# Patient Record
Sex: Male | Born: 1935 | Race: White | Hispanic: No | Marital: Married | Smoking: Current every day smoker
Health system: Southern US, Community
[De-identification: ages and names within clinical notes are randomized; demographics above are authoritative.]

## PROBLEM LIST (undated history)

## (undated) DIAGNOSIS — I10 Essential (primary) hypertension: Secondary | ICD-10-CM

## (undated) DIAGNOSIS — E119 Type 2 diabetes mellitus without complications: Secondary | ICD-10-CM

## (undated) HISTORY — DX: Essential (primary) hypertension: I10

## (undated) HISTORY — PX: CHOLECYSTECTOMY: SHX55

## (undated) HISTORY — DX: Type 2 diabetes mellitus without complications: E11.9

---

## 2005-07-14 ENCOUNTER — Other Ambulatory Visit: Payer: Self-pay

## 2005-07-15 ENCOUNTER — Inpatient Hospital Stay: Payer: Self-pay | Admitting: Internal Medicine

## 2006-01-06 ENCOUNTER — Other Ambulatory Visit: Payer: Self-pay

## 2006-01-06 ENCOUNTER — Emergency Department: Payer: Self-pay | Admitting: Emergency Medicine

## 2006-01-11 ENCOUNTER — Ambulatory Visit: Payer: Self-pay | Admitting: Urology

## 2009-12-06 ENCOUNTER — Ambulatory Visit: Payer: Self-pay | Admitting: Family

## 2011-05-29 DIAGNOSIS — I10 Essential (primary) hypertension: Secondary | ICD-10-CM | POA: Diagnosis not present

## 2011-05-29 DIAGNOSIS — E119 Type 2 diabetes mellitus without complications: Secondary | ICD-10-CM | POA: Diagnosis not present

## 2011-05-29 DIAGNOSIS — Z87891 Personal history of nicotine dependence: Secondary | ICD-10-CM | POA: Diagnosis not present

## 2011-08-01 DIAGNOSIS — E119 Type 2 diabetes mellitus without complications: Secondary | ICD-10-CM | POA: Diagnosis not present

## 2011-08-01 DIAGNOSIS — Z8739 Personal history of other diseases of the musculoskeletal system and connective tissue: Secondary | ICD-10-CM | POA: Diagnosis not present

## 2011-08-01 DIAGNOSIS — I10 Essential (primary) hypertension: Secondary | ICD-10-CM | POA: Diagnosis not present

## 2011-08-01 DIAGNOSIS — Z87891 Personal history of nicotine dependence: Secondary | ICD-10-CM | POA: Diagnosis not present

## 2011-10-31 DIAGNOSIS — I10 Essential (primary) hypertension: Secondary | ICD-10-CM | POA: Diagnosis not present

## 2011-10-31 DIAGNOSIS — E119 Type 2 diabetes mellitus without complications: Secondary | ICD-10-CM | POA: Diagnosis not present

## 2011-10-31 DIAGNOSIS — Z87891 Personal history of nicotine dependence: Secondary | ICD-10-CM | POA: Diagnosis not present

## 2011-11-14 DIAGNOSIS — E119 Type 2 diabetes mellitus without complications: Secondary | ICD-10-CM | POA: Diagnosis not present

## 2011-11-14 DIAGNOSIS — E785 Hyperlipidemia, unspecified: Secondary | ICD-10-CM | POA: Diagnosis not present

## 2011-11-14 DIAGNOSIS — I10 Essential (primary) hypertension: Secondary | ICD-10-CM | POA: Diagnosis not present

## 2012-02-15 DIAGNOSIS — Z23 Encounter for immunization: Secondary | ICD-10-CM | POA: Diagnosis not present

## 2012-02-15 DIAGNOSIS — I129 Hypertensive chronic kidney disease with stage 1 through stage 4 chronic kidney disease, or unspecified chronic kidney disease: Secondary | ICD-10-CM | POA: Diagnosis not present

## 2012-02-15 DIAGNOSIS — E1129 Type 2 diabetes mellitus with other diabetic kidney complication: Secondary | ICD-10-CM | POA: Diagnosis not present

## 2012-04-12 DIAGNOSIS — H2510 Age-related nuclear cataract, unspecified eye: Secondary | ICD-10-CM | POA: Diagnosis not present

## 2012-05-17 DIAGNOSIS — E1129 Type 2 diabetes mellitus with other diabetic kidney complication: Secondary | ICD-10-CM | POA: Diagnosis not present

## 2012-05-17 DIAGNOSIS — I129 Hypertensive chronic kidney disease with stage 1 through stage 4 chronic kidney disease, or unspecified chronic kidney disease: Secondary | ICD-10-CM | POA: Diagnosis not present

## 2012-08-09 DIAGNOSIS — N181 Chronic kidney disease, stage 1: Secondary | ICD-10-CM | POA: Diagnosis not present

## 2012-08-09 DIAGNOSIS — I129 Hypertensive chronic kidney disease with stage 1 through stage 4 chronic kidney disease, or unspecified chronic kidney disease: Secondary | ICD-10-CM | POA: Diagnosis not present

## 2012-08-09 DIAGNOSIS — E1129 Type 2 diabetes mellitus with other diabetic kidney complication: Secondary | ICD-10-CM | POA: Diagnosis not present

## 2012-08-09 DIAGNOSIS — N189 Chronic kidney disease, unspecified: Secondary | ICD-10-CM | POA: Diagnosis not present

## 2012-11-07 DIAGNOSIS — N189 Chronic kidney disease, unspecified: Secondary | ICD-10-CM | POA: Diagnosis not present

## 2012-11-07 DIAGNOSIS — I129 Hypertensive chronic kidney disease with stage 1 through stage 4 chronic kidney disease, or unspecified chronic kidney disease: Secondary | ICD-10-CM | POA: Diagnosis not present

## 2012-11-07 DIAGNOSIS — N181 Chronic kidney disease, stage 1: Secondary | ICD-10-CM | POA: Diagnosis not present

## 2012-11-07 DIAGNOSIS — E1129 Type 2 diabetes mellitus with other diabetic kidney complication: Secondary | ICD-10-CM | POA: Diagnosis not present

## 2013-02-07 DIAGNOSIS — N189 Chronic kidney disease, unspecified: Secondary | ICD-10-CM | POA: Diagnosis not present

## 2013-02-07 DIAGNOSIS — E1129 Type 2 diabetes mellitus with other diabetic kidney complication: Secondary | ICD-10-CM | POA: Diagnosis not present

## 2013-02-07 DIAGNOSIS — I129 Hypertensive chronic kidney disease with stage 1 through stage 4 chronic kidney disease, or unspecified chronic kidney disease: Secondary | ICD-10-CM | POA: Diagnosis not present

## 2013-02-07 DIAGNOSIS — R42 Dizziness and giddiness: Secondary | ICD-10-CM | POA: Diagnosis not present

## 2013-05-01 DIAGNOSIS — I129 Hypertensive chronic kidney disease with stage 1 through stage 4 chronic kidney disease, or unspecified chronic kidney disease: Secondary | ICD-10-CM | POA: Diagnosis not present

## 2013-05-01 DIAGNOSIS — E1129 Type 2 diabetes mellitus with other diabetic kidney complication: Secondary | ICD-10-CM | POA: Diagnosis not present

## 2013-05-06 DIAGNOSIS — I129 Hypertensive chronic kidney disease with stage 1 through stage 4 chronic kidney disease, or unspecified chronic kidney disease: Secondary | ICD-10-CM | POA: Diagnosis not present

## 2013-05-06 DIAGNOSIS — E1129 Type 2 diabetes mellitus with other diabetic kidney complication: Secondary | ICD-10-CM | POA: Diagnosis not present

## 2013-05-06 DIAGNOSIS — N181 Chronic kidney disease, stage 1: Secondary | ICD-10-CM | POA: Diagnosis not present

## 2013-05-06 DIAGNOSIS — N189 Chronic kidney disease, unspecified: Secondary | ICD-10-CM | POA: Diagnosis not present

## 2013-05-06 DIAGNOSIS — F17200 Nicotine dependence, unspecified, uncomplicated: Secondary | ICD-10-CM | POA: Diagnosis not present

## 2013-08-08 DIAGNOSIS — E1129 Type 2 diabetes mellitus with other diabetic kidney complication: Secondary | ICD-10-CM | POA: Diagnosis not present

## 2013-08-08 DIAGNOSIS — N189 Chronic kidney disease, unspecified: Secondary | ICD-10-CM | POA: Diagnosis not present

## 2013-08-08 DIAGNOSIS — Z87891 Personal history of nicotine dependence: Secondary | ICD-10-CM | POA: Diagnosis not present

## 2013-08-08 DIAGNOSIS — F17200 Nicotine dependence, unspecified, uncomplicated: Secondary | ICD-10-CM | POA: Diagnosis not present

## 2013-10-08 DIAGNOSIS — J309 Allergic rhinitis, unspecified: Secondary | ICD-10-CM | POA: Diagnosis not present

## 2013-10-08 DIAGNOSIS — Z87891 Personal history of nicotine dependence: Secondary | ICD-10-CM | POA: Diagnosis not present

## 2013-10-08 DIAGNOSIS — E1129 Type 2 diabetes mellitus with other diabetic kidney complication: Secondary | ICD-10-CM | POA: Diagnosis not present

## 2013-10-08 DIAGNOSIS — N189 Chronic kidney disease, unspecified: Secondary | ICD-10-CM | POA: Diagnosis not present

## 2014-01-09 DIAGNOSIS — E1129 Type 2 diabetes mellitus with other diabetic kidney complication: Secondary | ICD-10-CM | POA: Diagnosis not present

## 2014-01-09 DIAGNOSIS — H9190 Unspecified hearing loss, unspecified ear: Secondary | ICD-10-CM | POA: Diagnosis not present

## 2014-01-09 DIAGNOSIS — Z87891 Personal history of nicotine dependence: Secondary | ICD-10-CM | POA: Diagnosis not present

## 2014-01-09 DIAGNOSIS — N189 Chronic kidney disease, unspecified: Secondary | ICD-10-CM | POA: Diagnosis not present

## 2014-01-17 DIAGNOSIS — Z23 Encounter for immunization: Secondary | ICD-10-CM | POA: Diagnosis not present

## 2014-04-03 DIAGNOSIS — Z1389 Encounter for screening for other disorder: Secondary | ICD-10-CM | POA: Diagnosis not present

## 2014-04-03 DIAGNOSIS — Z720 Tobacco use: Secondary | ICD-10-CM | POA: Diagnosis not present

## 2014-04-03 DIAGNOSIS — I10 Essential (primary) hypertension: Secondary | ICD-10-CM | POA: Diagnosis not present

## 2014-04-03 DIAGNOSIS — E1122 Type 2 diabetes mellitus with diabetic chronic kidney disease: Secondary | ICD-10-CM | POA: Diagnosis not present

## 2014-07-03 DIAGNOSIS — I10 Essential (primary) hypertension: Secondary | ICD-10-CM | POA: Diagnosis not present

## 2014-07-03 DIAGNOSIS — E119 Type 2 diabetes mellitus without complications: Secondary | ICD-10-CM | POA: Diagnosis not present

## 2014-07-07 DIAGNOSIS — Z23 Encounter for immunization: Secondary | ICD-10-CM | POA: Diagnosis not present

## 2014-10-02 DIAGNOSIS — E1122 Type 2 diabetes mellitus with diabetic chronic kidney disease: Secondary | ICD-10-CM | POA: Diagnosis not present

## 2014-10-02 DIAGNOSIS — E119 Type 2 diabetes mellitus without complications: Secondary | ICD-10-CM | POA: Diagnosis not present

## 2014-10-02 DIAGNOSIS — Z9889 Other specified postprocedural states: Secondary | ICD-10-CM | POA: Diagnosis not present

## 2014-10-02 DIAGNOSIS — I10 Essential (primary) hypertension: Secondary | ICD-10-CM | POA: Diagnosis not present

## 2014-11-02 DIAGNOSIS — Z720 Tobacco use: Secondary | ICD-10-CM | POA: Diagnosis not present

## 2014-11-02 DIAGNOSIS — E119 Type 2 diabetes mellitus without complications: Secondary | ICD-10-CM | POA: Diagnosis not present

## 2014-11-02 DIAGNOSIS — E1122 Type 2 diabetes mellitus with diabetic chronic kidney disease: Secondary | ICD-10-CM | POA: Diagnosis not present

## 2014-11-02 DIAGNOSIS — Z8739 Personal history of other diseases of the musculoskeletal system and connective tissue: Secondary | ICD-10-CM | POA: Diagnosis not present

## 2014-11-03 DIAGNOSIS — I10 Essential (primary) hypertension: Secondary | ICD-10-CM | POA: Diagnosis not present

## 2014-11-03 DIAGNOSIS — R5381 Other malaise: Secondary | ICD-10-CM | POA: Diagnosis not present

## 2014-11-03 DIAGNOSIS — Z125 Encounter for screening for malignant neoplasm of prostate: Secondary | ICD-10-CM | POA: Diagnosis not present

## 2014-11-03 DIAGNOSIS — E784 Other hyperlipidemia: Secondary | ICD-10-CM | POA: Diagnosis not present

## 2014-11-03 DIAGNOSIS — E119 Type 2 diabetes mellitus without complications: Secondary | ICD-10-CM | POA: Diagnosis not present

## 2015-01-04 DIAGNOSIS — E1122 Type 2 diabetes mellitus with diabetic chronic kidney disease: Secondary | ICD-10-CM | POA: Diagnosis not present

## 2015-01-04 DIAGNOSIS — Z9889 Other specified postprocedural states: Secondary | ICD-10-CM | POA: Diagnosis not present

## 2015-01-04 DIAGNOSIS — E119 Type 2 diabetes mellitus without complications: Secondary | ICD-10-CM | POA: Diagnosis not present

## 2015-01-04 DIAGNOSIS — Z720 Tobacco use: Secondary | ICD-10-CM | POA: Diagnosis not present

## 2015-01-05 DIAGNOSIS — Z23 Encounter for immunization: Secondary | ICD-10-CM | POA: Diagnosis not present

## 2015-01-14 DIAGNOSIS — H6122 Impacted cerumen, left ear: Secondary | ICD-10-CM | POA: Diagnosis not present

## 2015-01-14 DIAGNOSIS — H93292 Other abnormal auditory perceptions, left ear: Secondary | ICD-10-CM | POA: Diagnosis not present

## 2015-01-14 DIAGNOSIS — H60392 Other infective otitis externa, left ear: Secondary | ICD-10-CM | POA: Diagnosis not present

## 2015-01-28 DIAGNOSIS — H90A11 Conductive hearing loss, unilateral, right ear with restricted hearing on the contralateral side: Secondary | ICD-10-CM | POA: Diagnosis not present

## 2015-01-28 DIAGNOSIS — H902 Conductive hearing loss, unspecified: Secondary | ICD-10-CM | POA: Diagnosis not present

## 2015-01-28 DIAGNOSIS — H903 Sensorineural hearing loss, bilateral: Secondary | ICD-10-CM | POA: Diagnosis not present

## 2015-01-28 DIAGNOSIS — H6982 Other specified disorders of Eustachian tube, left ear: Secondary | ICD-10-CM | POA: Diagnosis not present

## 2015-01-28 DIAGNOSIS — H6502 Acute serous otitis media, left ear: Secondary | ICD-10-CM | POA: Diagnosis not present

## 2015-02-25 DIAGNOSIS — H6982 Other specified disorders of Eustachian tube, left ear: Secondary | ICD-10-CM | POA: Diagnosis not present

## 2015-02-25 DIAGNOSIS — H6502 Acute serous otitis media, left ear: Secondary | ICD-10-CM | POA: Diagnosis not present

## 2015-03-09 DIAGNOSIS — E1122 Type 2 diabetes mellitus with diabetic chronic kidney disease: Secondary | ICD-10-CM | POA: Diagnosis not present

## 2015-06-04 DIAGNOSIS — Z789 Other specified health status: Secondary | ICD-10-CM | POA: Diagnosis not present

## 2015-06-04 DIAGNOSIS — E119 Type 2 diabetes mellitus without complications: Secondary | ICD-10-CM | POA: Diagnosis not present

## 2015-06-04 DIAGNOSIS — E1122 Type 2 diabetes mellitus with diabetic chronic kidney disease: Secondary | ICD-10-CM | POA: Diagnosis not present

## 2015-06-04 DIAGNOSIS — I10 Essential (primary) hypertension: Secondary | ICD-10-CM | POA: Diagnosis not present

## 2015-09-02 DIAGNOSIS — E1122 Type 2 diabetes mellitus with diabetic chronic kidney disease: Secondary | ICD-10-CM | POA: Diagnosis not present

## 2015-09-02 DIAGNOSIS — E119 Type 2 diabetes mellitus without complications: Secondary | ICD-10-CM | POA: Diagnosis not present

## 2015-09-02 DIAGNOSIS — J302 Other seasonal allergic rhinitis: Secondary | ICD-10-CM | POA: Diagnosis not present

## 2015-09-02 DIAGNOSIS — I10 Essential (primary) hypertension: Secondary | ICD-10-CM | POA: Diagnosis not present

## 2015-12-03 DIAGNOSIS — E119 Type 2 diabetes mellitus without complications: Secondary | ICD-10-CM | POA: Diagnosis not present

## 2015-12-03 DIAGNOSIS — Z23 Encounter for immunization: Secondary | ICD-10-CM | POA: Diagnosis not present

## 2015-12-03 DIAGNOSIS — I10 Essential (primary) hypertension: Secondary | ICD-10-CM | POA: Diagnosis not present

## 2016-03-02 DIAGNOSIS — Z720 Tobacco use: Secondary | ICD-10-CM | POA: Diagnosis not present

## 2016-03-02 DIAGNOSIS — I10 Essential (primary) hypertension: Secondary | ICD-10-CM | POA: Diagnosis not present

## 2016-03-02 DIAGNOSIS — E119 Type 2 diabetes mellitus without complications: Secondary | ICD-10-CM | POA: Diagnosis not present

## 2016-06-01 DIAGNOSIS — Z87891 Personal history of nicotine dependence: Secondary | ICD-10-CM | POA: Diagnosis not present

## 2016-06-01 DIAGNOSIS — Z789 Other specified health status: Secondary | ICD-10-CM | POA: Diagnosis not present

## 2016-06-01 DIAGNOSIS — R42 Dizziness and giddiness: Secondary | ICD-10-CM | POA: Diagnosis not present

## 2016-06-01 DIAGNOSIS — E1122 Type 2 diabetes mellitus with diabetic chronic kidney disease: Secondary | ICD-10-CM | POA: Diagnosis not present

## 2016-08-29 DIAGNOSIS — R42 Dizziness and giddiness: Secondary | ICD-10-CM | POA: Diagnosis not present

## 2016-08-29 DIAGNOSIS — Z87891 Personal history of nicotine dependence: Secondary | ICD-10-CM | POA: Diagnosis not present

## 2016-08-29 DIAGNOSIS — E1122 Type 2 diabetes mellitus with diabetic chronic kidney disease: Secondary | ICD-10-CM | POA: Diagnosis not present

## 2016-08-29 DIAGNOSIS — I10 Essential (primary) hypertension: Secondary | ICD-10-CM | POA: Diagnosis not present

## 2016-11-06 ENCOUNTER — Other Ambulatory Visit: Payer: Self-pay

## 2016-12-07 DIAGNOSIS — E669 Obesity, unspecified: Secondary | ICD-10-CM | POA: Diagnosis not present

## 2016-12-07 DIAGNOSIS — R0602 Shortness of breath: Secondary | ICD-10-CM | POA: Diagnosis not present

## 2016-12-07 DIAGNOSIS — Z87891 Personal history of nicotine dependence: Secondary | ICD-10-CM | POA: Diagnosis not present

## 2016-12-07 DIAGNOSIS — Z23 Encounter for immunization: Secondary | ICD-10-CM | POA: Diagnosis not present

## 2016-12-07 DIAGNOSIS — E1122 Type 2 diabetes mellitus with diabetic chronic kidney disease: Secondary | ICD-10-CM | POA: Diagnosis not present

## 2017-03-01 DIAGNOSIS — E669 Obesity, unspecified: Secondary | ICD-10-CM | POA: Diagnosis not present

## 2017-03-01 DIAGNOSIS — I10 Essential (primary) hypertension: Secondary | ICD-10-CM | POA: Diagnosis not present

## 2017-03-01 DIAGNOSIS — F0390 Unspecified dementia without behavioral disturbance: Secondary | ICD-10-CM | POA: Diagnosis not present

## 2017-03-01 DIAGNOSIS — R42 Dizziness and giddiness: Secondary | ICD-10-CM | POA: Diagnosis not present

## 2017-06-05 DIAGNOSIS — E669 Obesity, unspecified: Secondary | ICD-10-CM | POA: Diagnosis not present

## 2017-06-05 DIAGNOSIS — E119 Type 2 diabetes mellitus without complications: Secondary | ICD-10-CM | POA: Diagnosis not present

## 2017-06-05 DIAGNOSIS — F0390 Unspecified dementia without behavioral disturbance: Secondary | ICD-10-CM | POA: Diagnosis not present

## 2017-06-05 DIAGNOSIS — R42 Dizziness and giddiness: Secondary | ICD-10-CM | POA: Diagnosis not present

## 2017-08-21 DIAGNOSIS — E669 Obesity, unspecified: Secondary | ICD-10-CM | POA: Diagnosis not present

## 2017-08-21 DIAGNOSIS — E119 Type 2 diabetes mellitus without complications: Secondary | ICD-10-CM | POA: Diagnosis not present

## 2017-08-21 DIAGNOSIS — R42 Dizziness and giddiness: Secondary | ICD-10-CM | POA: Diagnosis not present

## 2017-08-21 DIAGNOSIS — F0390 Unspecified dementia without behavioral disturbance: Secondary | ICD-10-CM | POA: Diagnosis not present

## 2017-10-23 DIAGNOSIS — R42 Dizziness and giddiness: Secondary | ICD-10-CM | POA: Diagnosis not present

## 2017-10-23 DIAGNOSIS — E669 Obesity, unspecified: Secondary | ICD-10-CM | POA: Diagnosis not present

## 2017-10-23 DIAGNOSIS — Z87891 Personal history of nicotine dependence: Secondary | ICD-10-CM | POA: Diagnosis not present

## 2017-10-23 DIAGNOSIS — E119 Type 2 diabetes mellitus without complications: Secondary | ICD-10-CM | POA: Diagnosis not present

## 2018-01-22 DIAGNOSIS — E669 Obesity, unspecified: Secondary | ICD-10-CM | POA: Diagnosis not present

## 2018-01-22 DIAGNOSIS — E119 Type 2 diabetes mellitus without complications: Secondary | ICD-10-CM | POA: Diagnosis not present

## 2018-01-22 DIAGNOSIS — Z23 Encounter for immunization: Secondary | ICD-10-CM | POA: Diagnosis not present

## 2018-01-22 DIAGNOSIS — R42 Dizziness and giddiness: Secondary | ICD-10-CM | POA: Diagnosis not present

## 2018-01-22 DIAGNOSIS — Z87891 Personal history of nicotine dependence: Secondary | ICD-10-CM | POA: Diagnosis not present

## 2018-02-18 DIAGNOSIS — E119 Type 2 diabetes mellitus without complications: Secondary | ICD-10-CM | POA: Diagnosis not present

## 2018-02-18 DIAGNOSIS — Z87891 Personal history of nicotine dependence: Secondary | ICD-10-CM | POA: Diagnosis not present

## 2018-02-18 DIAGNOSIS — R42 Dizziness and giddiness: Secondary | ICD-10-CM | POA: Diagnosis not present

## 2018-02-18 DIAGNOSIS — E669 Obesity, unspecified: Secondary | ICD-10-CM | POA: Diagnosis not present

## 2018-02-18 DIAGNOSIS — Z0000 Encounter for general adult medical examination without abnormal findings: Secondary | ICD-10-CM | POA: Diagnosis not present

## 2018-03-01 ENCOUNTER — Other Ambulatory Visit: Payer: Self-pay

## 2018-04-23 DIAGNOSIS — E669 Obesity, unspecified: Secondary | ICD-10-CM | POA: Diagnosis not present

## 2018-04-23 DIAGNOSIS — Z789 Other specified health status: Secondary | ICD-10-CM | POA: Diagnosis not present

## 2018-04-23 DIAGNOSIS — E119 Type 2 diabetes mellitus without complications: Secondary | ICD-10-CM | POA: Diagnosis not present

## 2018-04-23 DIAGNOSIS — R42 Dizziness and giddiness: Secondary | ICD-10-CM | POA: Diagnosis not present

## 2018-10-21 DIAGNOSIS — Z720 Tobacco use: Secondary | ICD-10-CM | POA: Diagnosis not present

## 2018-10-21 DIAGNOSIS — F0280 Dementia in other diseases classified elsewhere without behavioral disturbance: Secondary | ICD-10-CM | POA: Diagnosis not present

## 2018-10-21 DIAGNOSIS — E1121 Type 2 diabetes mellitus with diabetic nephropathy: Secondary | ICD-10-CM | POA: Diagnosis not present

## 2018-10-21 DIAGNOSIS — E1322 Other specified diabetes mellitus with diabetic chronic kidney disease: Secondary | ICD-10-CM | POA: Diagnosis not present

## 2018-12-30 DIAGNOSIS — E1121 Type 2 diabetes mellitus with diabetic nephropathy: Secondary | ICD-10-CM | POA: Diagnosis not present

## 2018-12-30 DIAGNOSIS — I129 Hypertensive chronic kidney disease with stage 1 through stage 4 chronic kidney disease, or unspecified chronic kidney disease: Secondary | ICD-10-CM | POA: Diagnosis not present

## 2018-12-30 DIAGNOSIS — R0602 Shortness of breath: Secondary | ICD-10-CM | POA: Diagnosis not present

## 2018-12-30 DIAGNOSIS — E119 Type 2 diabetes mellitus without complications: Secondary | ICD-10-CM | POA: Diagnosis not present

## 2018-12-30 DIAGNOSIS — E669 Obesity, unspecified: Secondary | ICD-10-CM | POA: Diagnosis not present

## 2018-12-30 DIAGNOSIS — E1122 Type 2 diabetes mellitus with diabetic chronic kidney disease: Secondary | ICD-10-CM | POA: Diagnosis not present

## 2018-12-30 DIAGNOSIS — R42 Dizziness and giddiness: Secondary | ICD-10-CM | POA: Diagnosis not present

## 2018-12-30 DIAGNOSIS — N401 Enlarged prostate with lower urinary tract symptoms: Secondary | ICD-10-CM | POA: Diagnosis not present

## 2018-12-30 DIAGNOSIS — F0280 Dementia in other diseases classified elsewhere without behavioral disturbance: Secondary | ICD-10-CM | POA: Diagnosis not present

## 2019-01-13 DIAGNOSIS — Z0000 Encounter for general adult medical examination without abnormal findings: Secondary | ICD-10-CM | POA: Diagnosis not present

## 2019-01-13 DIAGNOSIS — E1121 Type 2 diabetes mellitus with diabetic nephropathy: Secondary | ICD-10-CM | POA: Diagnosis not present

## 2019-01-13 DIAGNOSIS — E669 Obesity, unspecified: Secondary | ICD-10-CM | POA: Diagnosis not present

## 2019-01-13 DIAGNOSIS — F0280 Dementia in other diseases classified elsewhere without behavioral disturbance: Secondary | ICD-10-CM | POA: Diagnosis not present

## 2019-01-13 DIAGNOSIS — I129 Hypertensive chronic kidney disease with stage 1 through stage 4 chronic kidney disease, or unspecified chronic kidney disease: Secondary | ICD-10-CM | POA: Diagnosis not present

## 2019-01-20 DIAGNOSIS — Z23 Encounter for immunization: Secondary | ICD-10-CM | POA: Diagnosis not present

## 2019-02-05 ENCOUNTER — Ambulatory Visit: Payer: Self-pay | Admitting: Urology

## 2019-03-04 ENCOUNTER — Ambulatory Visit: Payer: Self-pay | Admitting: Urology

## 2019-04-01 ENCOUNTER — Ambulatory Visit (INDEPENDENT_AMBULATORY_CARE_PROVIDER_SITE_OTHER): Payer: Medicare Other | Admitting: Urology

## 2019-04-01 ENCOUNTER — Other Ambulatory Visit: Payer: Self-pay

## 2019-04-01 ENCOUNTER — Encounter: Payer: Self-pay | Admitting: Urology

## 2019-04-01 VITALS — BP 148/84 | HR 74 | Ht 72.0 in | Wt 200.0 lb

## 2019-04-01 DIAGNOSIS — N400 Enlarged prostate without lower urinary tract symptoms: Secondary | ICD-10-CM | POA: Diagnosis not present

## 2019-04-01 DIAGNOSIS — N138 Other obstructive and reflux uropathy: Secondary | ICD-10-CM | POA: Diagnosis not present

## 2019-04-01 DIAGNOSIS — R339 Retention of urine, unspecified: Secondary | ICD-10-CM | POA: Diagnosis not present

## 2019-04-01 DIAGNOSIS — N401 Enlarged prostate with lower urinary tract symptoms: Secondary | ICD-10-CM | POA: Diagnosis not present

## 2019-04-01 DIAGNOSIS — N3941 Urge incontinence: Secondary | ICD-10-CM | POA: Diagnosis not present

## 2019-04-01 MED ORDER — FINASTERIDE 5 MG PO TABS: 5.0000 mg | ORAL_TABLET | Freq: Every day | ORAL | 11 refills | Status: AC

## 2019-04-01 MED ORDER — TAMSULOSIN HCL 0.4 MG PO CAPS: 0.4000 mg | ORAL_CAPSULE | Freq: Every day | ORAL | 11 refills | Status: AC

## 2019-04-01 NOTE — Progress Notes (Signed)
04/01/2019 5:02 PM   Waymon Budge Beller Sr. 1935-07-31 [id number]  Referring provider: Cletis Athens, MD [address] [place],  [place] [address]  Chief Complaint  Patient presents with  . Other    HPI: 83 year old male with history of dementia who presents today for further evaluation of lower urinary tract symptoms for his primary care physician.  He is accompanied today by his daughter-in-law.  The patient reports that he has a slightly decreased treatment does not feel like he empties his bladder completely.  He also has some occasional urgency and urge incontinence.  His daughter-in-law believes it is more frequent than the patient lets on.  Happens both daytime and nighttime.  He reports that he knows he has to void but cannot get up and get to the bathroom on time.  He denies any burning, hematuria or any other UTI symptoms.  This is been going on for several years but seems to have progressed.  No recent labs.  Never previously been on any BPH meds.    No recent imaging.   IPSS    Row Name 04/01/19 1500         International Prostate Symptom Score   How often have you had the sensation of not emptying your bladder?  Less than 1 in 5     How often have you had to urinate less than every two hours?  About half the time     How often have you found you stopped and started again several times when you urinated?  About half the time     How often have you found it difficult to postpone urination?  More than half the time     How often have you had a weak urinary stream?  Almost always     How often have you had to strain to start urination?  Not at All     How many times did you typically get up at night to urinate?  1 Time     Total IPSS Score  17       Quality of Life due to urinary symptoms   If you were to spend the rest of your life with your urinary condition just the way it is now how would you feel about that?  Mixed        Score:  1-7 Mild 8-19 Moderate  20-35 Severe   PMH: Past Medical History:  Diagnosis Date  . Diabetes mellitus without complication ([place])   . Hypertension     Surgical History: Past Surgical History:  Procedure Laterality Date  . CHOLECYSTECTOMY      Home Medications:  Allergies as of 04/01/2019   No Known Allergies     Medication List       Accurate as of April 01, 2019  5:02 PM. If you have any questions, ask your nurse or doctor.        aspirin EC 81 MG tablet Take 81 mg by mouth daily.   finasteride 5 MG tablet Commonly known as: PROSCAR Take 1 tablet (5 mg total) by mouth daily. Started by: Hollice Espy, MD   loratadine 10 MG tablet Commonly known as: CLARITIN TK 1 T PO D   memantine 5 MG tablet Commonly known as: NAMENDA TK 1 T PO D   metFORMIN 500 MG tablet Commonly known as: GLUCOPHAGE TK 1 T PO BID   metoprolol tartrate 25 MG tablet Commonly known as: LOPRESSOR TK 1 T PO D   tamsulosin  0.4 MG Caps capsule Commonly known as: Flomax Take 1 capsule (0.4 mg total) by mouth daily. Started by: Hollice Espy, MD   Vitamin D3 125 MCG (5000 UT) Caps TK 1 C PO WEEKLY       Allergies: No Known Allergies  Family History: History reviewed. No pertinent family history.  Social History:  reports that he has been smoking. He has never used smokeless tobacco. He reports that he does not drink alcohol or use drugs.  ROS: UROLOGY Frequent Urination?: No Hard to postpone urination?: Yes Burning/pain with urination?: No Get up at night to urinate?: Yes Leakage of urine?: Yes Urine stream starts and stops?: No Trouble starting stream?: No Do you have to strain to urinate?: No Blood in urine?: No Urinary tract infection?: No Sexually transmitted disease?: No Injury to kidneys or bladder?: No Painful intercourse?: No Weak stream?: No Erection problems?: No Penile pain?: No  Gastrointestinal Nausea?: No Vomiting?: No Indigestion/heartburn?: No Diarrhea?: No  Constipation?: No  Constitutional Fever: No Night sweats?: No Weight loss?: No Fatigue?: No  Skin Skin rash/lesions?: No Itching?: No  Eyes Blurred vision?: No Double vision?: No  Ears/Nose/Throat Sore throat?: No Sinus problems?: No  Hematologic/Lymphatic Swollen glands?: No Easy bruising?: No  Cardiovascular Leg swelling?: No Chest pain?: No  Respiratory Cough?: No Shortness of breath?: No  Endocrine Excessive thirst?: No  Musculoskeletal Back pain?: No Joint pain?: No  Neurological Headaches?: No Dizziness?: No  Psychologic Depression?: No Anxiety?: No  Physical Exam: BP (!) 148/84   Pulse 74   Ht 6' (1.829 m)   Wt 200 lb (90.7 kg)   BMI 27.12 kg/m   Constitutional:  Alert and oriented, No acute distress.  Accompanied by daughter-in-law who provides some additional history today.  Extremely pleasant. HEENT: [place] AT, moist mucus membranes.  Trachea midline, no masses. Cardiovascular: No clubbing, cyanosis, or edema. Respiratory: Normal respiratory effort, no increased work of breathing. GI: Abdomen is soft, nontender, nondistended, no abdominal masses Rectal: External hemorrhoids.  Normal sphincter tone.  Significant prostamegaly, 50+ cc, nonnodular, nontender. Skin: No rashes, bruises or suspicious lesions. Neurologic: Grossly intact, no focal deficits, moving all 4 extremities. Psychiatric: Normal mood and affect.  Pertinent Imaging: PVR initially 250, asked to void, repeat PVR 200 cc  Assessment & Plan:    1. BPH with urinary obstruction Significant prostamegaly with obstructive and irritative voiding symptoms  Not currently on any BPH medications  Given size of his prostate gland, he might benefit from both Flomax and finasteride for optimal management.  He is agreeable this plan.  He is not interested in surgical intervention.  Plan for labs today including BMP and PSA for baseline, extremely high threshold to intervene unless his PSA  is markedly elevated.  - Bladder Scan (Post Void Residual) in office - Basic metabolic panel - PSA  2. Incomplete bladder emptying Incomplete bladder emptying  3. Urge incontinence of urine Likely multifactorial and possibly related to memory attention issues as well as mobility  We will go ahead and treat his BPH with obstructive urinary symptoms and see if his episodes of incontinence improved.  Would not like to start the patient on any anticholinergic due to concern for mental status changes and bladder emptying at this point time.  Reassess next visit.   Return in about 3 months (around 06/30/2019) for IPSS/ PVR/ UA with PA.  Hollice Espy, MD  [facility] [address] [place], [place] [address] [phone]

## 2019-04-02 ENCOUNTER — Telehealth: Payer: Self-pay | Admitting: *Deleted

## 2019-04-02 LAB — BASIC METABOLIC PANEL
BUN/Creatinine Ratio: 9 — ABNORMAL LOW (ref 10–24)
BUN: 11 mg/dL (ref 8–27)
CO2: 21 mmol/L (ref 20–29)
Calcium: 9 mg/dL (ref 8.6–10.2)
Chloride: 104 mmol/L (ref 96–106)
Creatinine, Ser: 1.2 mg/dL (ref 0.76–1.27)
GFR calc Af Amer: 65 mL/min/{1.73_m2} (ref >59)
GFR calc non Af Amer: 56 mL/min/{1.73_m2} — ABNORMAL LOW (ref >59)
Glucose: 128 mg/dL — ABNORMAL HIGH (ref 65–99)
Potassium: 4.2 mmol/L (ref 3.5–5.2)
Sodium: 140 mmol/L (ref 134–144)

## 2019-04-02 LAB — PSA: Prostate Specific Ag, Serum: 3 ng/mL (ref 0.0–4.0)

## 2019-04-02 NOTE — Telephone Encounter (Addendum)
Patient informed-voiced understanding.   ----- Message from Hollice Espy, MD sent at 04/02/2019  8:19 AM EST ----- Labs look good. Blood sugar slightly elevated. PSA is excellent for age and size of prostate gland.  metacarpal

## 2019-04-22 DIAGNOSIS — I129 Hypertensive chronic kidney disease with stage 1 through stage 4 chronic kidney disease, or unspecified chronic kidney disease: Secondary | ICD-10-CM | POA: Diagnosis not present

## 2019-04-22 DIAGNOSIS — E1121 Type 2 diabetes mellitus with diabetic nephropathy: Secondary | ICD-10-CM | POA: Diagnosis not present

## 2019-04-22 DIAGNOSIS — E669 Obesity, unspecified: Secondary | ICD-10-CM | POA: Diagnosis not present

## 2019-04-22 DIAGNOSIS — F0280 Dementia in other diseases classified elsewhere without behavioral disturbance: Secondary | ICD-10-CM | POA: Diagnosis not present

## 2019-06-19 DIAGNOSIS — F0280 Dementia in other diseases classified elsewhere without behavioral disturbance: Secondary | ICD-10-CM | POA: Diagnosis not present

## 2019-06-19 DIAGNOSIS — I129 Hypertensive chronic kidney disease with stage 1 through stage 4 chronic kidney disease, or unspecified chronic kidney disease: Secondary | ICD-10-CM | POA: Diagnosis not present

## 2019-06-19 DIAGNOSIS — E1121 Type 2 diabetes mellitus with diabetic nephropathy: Secondary | ICD-10-CM | POA: Diagnosis not present

## 2019-06-19 DIAGNOSIS — E669 Obesity, unspecified: Secondary | ICD-10-CM | POA: Diagnosis not present

## 2019-07-01 ENCOUNTER — Encounter: Payer: Self-pay | Admitting: Urology

## 2019-07-01 ENCOUNTER — Ambulatory Visit (INDEPENDENT_AMBULATORY_CARE_PROVIDER_SITE_OTHER): Payer: Medicare Other | Admitting: Urology

## 2019-07-01 ENCOUNTER — Other Ambulatory Visit: Payer: Self-pay

## 2019-07-01 VITALS — BP 112/73 | HR 76 | Ht 72.0 in | Wt 220.0 lb

## 2019-07-01 DIAGNOSIS — N3941 Urge incontinence: Secondary | ICD-10-CM

## 2019-07-01 DIAGNOSIS — N138 Other obstructive and reflux uropathy: Secondary | ICD-10-CM

## 2019-07-01 DIAGNOSIS — N401 Enlarged prostate with lower urinary tract symptoms: Secondary | ICD-10-CM

## 2019-07-01 DIAGNOSIS — R339 Retention of urine, unspecified: Secondary | ICD-10-CM | POA: Diagnosis not present

## 2019-07-01 DIAGNOSIS — R3129 Other microscopic hematuria: Secondary | ICD-10-CM

## 2019-07-01 LAB — BLADDER SCAN AMB NON-IMAGING: Scan Result: 177

## 2019-07-01 MED ORDER — TADALAFIL 5 MG PO TABS: 5.0000 mg | ORAL_TABLET | Freq: Every day | ORAL | 1 refills | Status: AC | PRN

## 2019-07-01 NOTE — Progress Notes (Signed)
In and Out Catheterization  Patient is present today for a I & O catheterization due to BPH. Patient was cleaned and prepped in a sterile fashion with betadine . A 14FR cath was inserted no complications were noted , 153ml of urine return was noted, urine was orange in color. A clean urine sample was collected for ua. Bladder was drained  And catheter was removed with out difficulty.    Preformed by: Elberta Leatherwood, CMA

## 2019-07-01 NOTE — Progress Notes (Signed)
07/01/2019 2:53 PM   Jorge Budge Lecomte Sr. 02-13-1936 [id number]  Referring provider: Cletis Athens, MD [address] [place],  [place] [address]  Chief Complaint  Patient presents with  . Benign Prostatic Hypertrophy    HPI: Mr. Weidert is an 84 year old male with BPH with urinary obstruction, incomplete bladder emptying and urge incontinence with his daughter-in-law, Peggye Fothergill.    BPH WITH LUTS  (prostate and/or bladder) IPSS score: 18/4    PVR: 177 cc   Previous score: 17/3  Previous PVR: 200 cc  Major complaint(s):  Still having urge incontinence.  Feels that the tamsulosin caused a rash and hasn't helped much with the incontinence.  Denies any dysuria, hematuria or suprapubic pain.   Currently taking: finasteride 5 mg daily and tamsulosin 0.4 mg daily   Denies any recent fevers, chills, nausea or vomiting.  He was unable to void today for specimen so he was straight cathed.  His UA was orange clear, specific gravity greater than 1.030, 1+ protein, trace RBCs, 0-5 WBCs, 0-10 epithelial cells, 3-10 RBCs, 0-10 renal epithelial cells, hyaline casts are present and mucus is present.  IPSS    Row Name 07/01/19 1400         International Prostate Symptom Score   How often have you had the sensation of not emptying your bladder?  More than half the time     How often have you had to urinate less than every two hours?  Less than half the time     How often have you found you stopped and started again several times when you urinated?  Less than 1 in 5 times     How often have you found it difficult to postpone urination?  About half the time     How often have you had a weak urinary stream?  Almost always     How often have you had to strain to start urination?  Less than 1 in 5 times     How many times did you typically get up at night to urinate?  2 Times     Total IPSS Score  18       Quality of Life due to urinary symptoms   If you were to spend the rest of your life with your  urinary condition just the way it is now how would you feel about that?  Mostly Disatisfied        Score:  1-7 Mild 8-19 Moderate 20-35 Severe  PMH: Past Medical History:  Diagnosis Date  . Diabetes mellitus without complication ([place])   . Hypertension     Surgical History: Past Surgical History:  Procedure Laterality Date  . CHOLECYSTECTOMY      Home Medications:  Allergies as of 07/01/2019   No Known Allergies     Medication List       Accurate as of July 01, 2019 11:59 PM. If you have any questions, ask your nurse or doctor.        aspirin EC 81 MG tablet Take 81 mg by mouth daily.   eucerin cream Apply topically as needed for dry skin.   finasteride 5 MG tablet Commonly known as: PROSCAR Take 1 tablet (5 mg total) by mouth daily.   loratadine 10 MG tablet Commonly known as: CLARITIN TK 1 T PO D   memantine 5 MG tablet Commonly known as: NAMENDA TK 1 T PO D   metFORMIN 500 MG tablet Commonly known as: GLUCOPHAGE TK  1 T PO BID   metoprolol tartrate 25 MG tablet Commonly known as: LOPRESSOR TK 1 T PO D   tadalafil 5 MG tablet Commonly known as: CIALIS Take 1 tablet (5 mg total) by mouth daily as needed for erectile dysfunction. Started by: Zara Council, PA-C   tamsulosin 0.4 MG Caps capsule Commonly known as: Flomax Take 1 capsule (0.4 mg total) by mouth daily.   Vitamin D3 125 MCG (5000 UT) Caps TK 1 C PO WEEKLY       Allergies: No Known Allergies  Family History: History reviewed. No pertinent family history.  Social History:  reports that he has been smoking. He has never used smokeless tobacco. He reports that he does not drink alcohol or use drugs.  ROS: Pertinent ROS in HPI  Physical Exam: BP 112/73   Pulse 76   Ht 6' (1.829 m)   Wt 220 lb (99.8 kg)   BMI 29.84 kg/m   Constitutional:  Well nourished. Alert and oriented, No acute distress. HEENT: [place] AT, mask in place.  Trachea midline, no masses. Cardiovascular: No  clubbing, cyanosis, or edema. Respiratory: Normal respiratory effort, no increased work of breathing. Neurologic: Grossly intact, no focal deficits, moving all 4 extremities. Psychiatric: Normal mood and affect.  Laboratory Data: Lab Results  Component Value Date   CREATININE 1.20 04/01/2019   PSA 3.0 04/01/2019  Urinalysis Component     Latest Ref Rng & Units 07/01/2019  Specific Gravity, UA     1.005 - 1.030 >1.030 (H)  pH, UA     5.0 - 7.5 5.5  Color, UA     Yellow Orange  Appearance Ur     Clear Clear  Leukocytes,UA     Negative Negative  Protein,UA     Negative/Trace 1+ (A)  Glucose, UA     Negative Negative  Ketones, UA     Negative Negative  RBC, UA     Negative Trace (A)  Bilirubin, UA     Negative Negative  Urobilinogen, Ur     0.2 - 1.0 mg/dL 0.2  Nitrite, UA     Negative Negative  Microscopic Examination      See below:   Component     Latest Ref Rng & Units 07/01/2019  WBC, UA     0 - 5 /hpf 0-5  RBC     0 - 2 /hpf 3-10 (A)  Epithelial Cells (non renal)     0 - 10 /hpf 0-10  Renal Epithel, UA     None seen /hpf 0-10 (A)  Casts     None seen /lpf Present (A)  Cast Type     N/A Hyaline casts  Mucus, UA     Not Estab. Present (A)  Bacteria, UA     None seen/Few None seen    I have reviewed the labs.   Pertinent Imaging: Results for orders placed or performed in visit on 07/01/19  CULTURE, URINE COMPREHENSIVE   Specimen: Urine   UR  Result Value Ref Range   Urine Culture, Comprehensive Final report    Organism ID, Bacteria Comment   Microscopic Examination   URINE  Result Value Ref Range   WBC, UA 0-5 0 - 5 /hpf   RBC 3-10 (A) 0 - 2 /hpf   Epithelial Cells (non renal) 0-10 0 - 10 /hpf   Renal Epithel, UA 0-10 (A) None seen /hpf   Casts Present (A) None seen /lpf   Cast Type Hyaline casts N/A  Mucus, UA Present (A) Not Estab.   Bacteria, UA None seen None seen/Few  Urinalysis, Complete  Result Value Ref Range   Specific  Gravity, UA >1.030 (H) 1.005 - 1.030   pH, UA 5.5 5.0 - 7.5   Color, UA Orange Yellow   Appearance Ur Clear Clear   Leukocytes,UA Negative Negative   Protein,UA 1+ (A) Negative/Trace   Glucose, UA Negative Negative   Ketones, UA Negative Negative   RBC, UA Trace (A) Negative   Bilirubin, UA Negative Negative   Urobilinogen, Ur 0.2 0.2 - 1.0 mg/dL   Nitrite, UA Negative Negative   Microscopic Examination See below:   Bladder Scan (Post Void Residual) in office  Result Value Ref Range   Scan Result 177     Assessment & Plan:    1. BPH with LUTS IPSS score is 18/4, it is stable Continue conservative management, avoiding bladder irritants and timed voiding's Most bothersome symptoms is/are urge incontinence  Discontinue the tamsulosin as it may be causing a rash Continue the finasteride 5 mg daily We will have a trial of Cialis 5 mg daily, prescription sent to [facility] in one month for I PSS and exam   2. Incomplete bladder emptying - Bladder Scan (Post Void Residual) in office PVR improved, but he does not see an improvement in symptoms  3. Urge incontinence of urine - Urinalysis, Complete  Microscopic hematuria is present on specimen but it may be due to the trauma of catheterization, but will send for culture as he is having urge incontinence  4.  Microscopic hematuria -We will hopefully be able to obtain a voided specimen upon return to recheck for microscopic hematuria as today's microscopic hematuria may be the result of catheter trauma -He is to report any gross hematuria in the interim  Return in about 1 month (around 08/01/2019) for IPSS, UA and PVR.  These notes generated with voice recognition software. I apologize for typographical errors.  Zara Council, PA-C  [facility] [address]  [place], [place] [address] [phone]

## 2019-07-02 LAB — URINALYSIS, COMPLETE
Bilirubin, UA: NEGATIVE
Glucose, UA: NEGATIVE
Ketones, UA: NEGATIVE
Leukocytes,UA: NEGATIVE
Nitrite, UA: NEGATIVE
Specific Gravity, UA: >1.03 — ABNORMAL HIGH (ref 1.005–1.030)
Urobilinogen, Ur: 0.2 mg/dL (ref 0.2–1.0)
pH, UA: 5.5 (ref 5.0–7.5)

## 2019-07-02 LAB — MICROSCOPIC EXAMINATION: Bacteria, UA: NONE SEEN

## 2019-07-04 LAB — CULTURE, URINE COMPREHENSIVE

## 2019-08-05 ENCOUNTER — Ambulatory Visit: Payer: Self-pay | Admitting: Urology

## 2019-08-29 ENCOUNTER — Telehealth: Payer: Self-pay | Admitting: Urology

## 2019-08-29 NOTE — Telephone Encounter (Signed)
Would you call Jorge Campbell and have him provide another urine so that we can make sure his microscopic hematuria has cleared?

## 2019-09-01 NOTE — Telephone Encounter (Signed)
Was instructed by patient to call daughter-in-law Peggye Fothergill [place] to schedule appointment with Zara Council for frequency and needs a ua to confirm hematuria has cleared. LMOM to return call.

## 2019-09-01 NOTE — Telephone Encounter (Signed)
Unable to reach patient by phone as it was busy.

## 2019-09-02 NOTE — Telephone Encounter (Signed)
Daughter-in-law, Peggye Fothergill, states she doesn't feel that patient needs an appointment at this time. Reports patient says medication isn't working and he does not want to have surgery. Explained reason for appointment. Jodie states she will discuss with patient and call back if he wants to make an appointment.

## 2019-09-18 ENCOUNTER — Ambulatory Visit (INDEPENDENT_AMBULATORY_CARE_PROVIDER_SITE_OTHER): Payer: Medicare Other | Admitting: Internal Medicine

## 2019-09-18 ENCOUNTER — Encounter: Payer: Self-pay | Admitting: Internal Medicine

## 2019-09-18 VITALS — BP 112/65 | HR 76 | Wt 206.3 lb

## 2019-09-18 DIAGNOSIS — J301 Allergic rhinitis due to pollen: Secondary | ICD-10-CM | POA: Insufficient documentation

## 2019-09-18 DIAGNOSIS — E1159 Type 2 diabetes mellitus with other circulatory complications: Secondary | ICD-10-CM | POA: Insufficient documentation

## 2019-09-18 DIAGNOSIS — R269 Unspecified abnormalities of gait and mobility: Secondary | ICD-10-CM | POA: Insufficient documentation

## 2019-09-18 DIAGNOSIS — I10 Essential (primary) hypertension: Secondary | ICD-10-CM | POA: Diagnosis not present

## 2019-09-18 DIAGNOSIS — F0391 Unspecified dementia with behavioral disturbance: Secondary | ICD-10-CM | POA: Diagnosis not present

## 2019-09-18 DIAGNOSIS — F03918 Unspecified dementia, unspecified severity, with other behavioral disturbance: Secondary | ICD-10-CM | POA: Insufficient documentation

## 2019-09-18 NOTE — Assessment & Plan Note (Signed)
No hallucination   Mood is okay . not depressed.

## 2019-09-18 NOTE — Assessment & Plan Note (Signed)
Unsteady gait patient was advised to walk with a stick.

## 2019-09-18 NOTE — Progress Notes (Signed)
Patient ID: Jorge Storz Munley Sr., male   DOB: 05/26/1935, 84 y.o.   MRN: [id number]    Established Patient Office Visit  Subjective:  Patient ID: Jorge Budge Hussein Sr., male    DOB: [birth year]  Age: 84 y.o. MRN: [id number]  CC:  Chief Complaint  Patient presents with  . Diabetes    3 month follow up for diabetes     HPI  Jorge Schumacker Stonesifer Sr. presents for a 3 month follow up regarding his diabetes. He reports that he occasionally gets dizzy/lightheaded while reading or watching TV. The patient denies chest pain, SOB, vision problems, hearing problems, depressed mood, and any other symptoms and complaints at this time. His hip hurts a little bit if he moves in a certain way. He is taking his medications as prescribed. He showers everyday or every other day. The patient has a history of dementia.   Past Medical History:  Diagnosis Date  . Diabetes mellitus without complication ([place])   . Hypertension     Past Surgical History:  Procedure Laterality Date  . CHOLECYSTECTOMY      History reviewed. No pertinent family history.  Social History   Socioeconomic History  . Marital status: Married    Spouse name: Not on file  . Number of children: Not on file  . Years of education: Not on file  . Highest education level: Not on file  Occupational History  . Not on file  Tobacco Use  . Smoking status: Current Every Day Smoker  . Smokeless tobacco: Never Used  Substance and Sexual Activity  . Alcohol use: Never  . Drug use: Never  . Sexual activity: Not on file  Other Topics Concern  . Not on file  Social History Narrative  . Not on file   Social Determinants of Health   Financial Resource Strain:   . Difficulty of Paying Living Expenses:   Food Insecurity:   . Worried About Charity fundraiser in the Last Year:   . Arboriculturist in the Last Year:   Transportation Needs:   . Film/video editor (Medical):   Marland Kitchen Lack of Transportation (Non-Medical):   Physical Activity:   .  Days of Exercise per Week:   . Minutes of Exercise per Session:   Stress:   . Feeling of Stress :   Social Connections:   . Frequency of Communication with Friends and Family:   . Frequency of Social Gatherings with Friends and Family:   . Attends Religious Services:   . Active Member of Clubs or Organizations:   . Attends Archivist Meetings:   Marland Kitchen Marital Status:   Intimate Partner Violence:   . Fear of Current or Ex-Partner:   . Emotionally Abused:   Marland Kitchen Physically Abused:   . Sexually Abused:      Current Outpatient Medications:  .  aspirin EC 81 MG tablet, Take 81 mg by mouth daily., Disp: , Rfl:  .  Cholecalciferol (VITAMIN D3) 125 MCG (5000 UT) CAPS, TK 1 C PO WEEKLY, Disp: , Rfl:  .  finasteride (PROSCAR) 5 MG tablet, Take 1 tablet (5 mg total) by mouth daily., Disp: 30 tablet, Rfl: 11 .  loratadine (CLARITIN) 10 MG tablet, TK 1 T PO D, Disp: , Rfl:  .  memantine (NAMENDA) 5 MG tablet, TK 1 T PO D, Disp: , Rfl:  .  metFORMIN (GLUCOPHAGE) 500 MG tablet, TK 1 T PO BID, Disp: , Rfl:  .  metoprolol  tartrate (LOPRESSOR) 25 MG tablet, TK 1 T PO D, Disp: , Rfl:  .  Skin Protectants, Misc. (EUCERIN) cream, Apply topically as needed for dry skin., Disp: , Rfl:  .  tadalafil (CIALIS) 5 MG tablet, Take 1 tablet (5 mg total) by mouth daily as needed for erectile dysfunction., Disp: 30 tablet, Rfl: 1 .  tamsulosin (FLOMAX) 0.4 MG CAPS capsule, Take 1 capsule (0.4 mg total) by mouth daily., Disp: 30 capsule, Rfl: 11   No Known Allergies  ROS Review of Systems  Constitutional: Negative.   HENT: Negative.   Eyes: Negative.   Respiratory: Negative.   Cardiovascular: Negative.  Negative for chest pain.  Gastrointestinal: Negative.   Endocrine: Negative.   Genitourinary: Negative.   Musculoskeletal: Positive for gait problem.       Hip pain  Skin: Negative.   Allergic/Immunologic: Negative.   Neurological: Positive for dizziness and light-headedness.  Hematological:  Negative.   Psychiatric/Behavioral: Negative.  Negative for dysphoric mood.      Objective:    Physical Exam  Constitutional: The patient is oriented to person, place, and time. Pt appears well-developed and well-nourished. Gait is unsteady. Head: Normocephalic and atraumatic.  Eyes: Pupils are equal, round, and reactive to light.  Neck: No JVD present. No tracheal deviation present. No thyromegaly present.  Cardiovascular: Regular rate and rhythm. No gallop. Pulmonary/Chest: Normal breath sounds. Lungs clear to auscultation. Abdominal: No abdominal tenderness. No guarding or rebound tenderness.. Musculoskeletal: Normal range of motion. No tenderness of the calves. Lymphatic: No cervical adenopathy.  Neurological: No cranial nerve deficit.  Skin: Skin is warm and hydrated.  Psychiatric: The patient has a normal mood and affect.  BP 112/65   Pulse 76   Wt 206 lb 4.8 oz (93.6 kg)   BMI 27.98 kg/m  Wt Readings from Last 3 Encounters:  09/18/19 206 lb 4.8 oz (93.6 kg)  07/01/19 220 lb (99.8 kg)  04/01/19 200 lb (90.7 kg)     Health Maintenance Due  Topic Date Due  . URINE MICROALBUMIN  Never done  . COVID-19 Vaccine (1) Never done  . TETANUS/TDAP  Never done  . PNA vac Low Risk Adult (1 of 2 - PCV13) Never done    There are no preventive care reminders to display for this patient.  No results found for: TSH No results found for: WBC, HGB, HCT, MCV, PLT Lab Results  Component Value Date   NA 140 04/01/2019   K 4.2 04/01/2019   CO2 21 04/01/2019   GLUCOSE 128 (H) 04/01/2019   BUN 11 04/01/2019   CREATININE 1.20 04/01/2019   CALCIUM 9.0 04/01/2019   No results found for: CHOL No results found for: HDL No results found for: LDLCALC No results found for: TRIG No results found for: CHOLHDL No results found for: HGBA1C    Assessment & Plan:   Problem List Items Addressed This Visit      Cardiovascular and Mediastinum   Essential hypertension - Primary     Blood pressure is stable at the present time.        Nervous and Auditory   Dementia with behavioral disturbance (HCC)    No hallucination   Mood is okay . not depressed.        Other   Gait abnormality    Unsteady gait patient was advised to walk with a stick.         No orders of the defined types were placed in this encounter.   Follow-up: Return  in about 3 months (around 12/19/2019).   By signing my name below, I, De Burrs, attest that this documentation has been prepared under the direction and in the presence of Cletis Athens, MD. Electronically Signed: De Burrs, Medical Scribe. 09/18/19. 3:04 PM.  I personally performed the services described in this documentation, which was SCRIBED in my presence. The recorded information has been reviewed and considered accurate. It has been edited as necessary during review. Cletis Athens, MD

## 2019-09-18 NOTE — Assessment & Plan Note (Signed)
Blood pressure is stable at the present time 

## 2019-09-18 NOTE — Assessment & Plan Note (Signed)
Stable

## 2019-09-21 ENCOUNTER — Observation Stay
Admission: EM | Admit: 2019-09-21 | Discharge: 2019-09-22 | Disposition: A | Discharge status: Other Acute Inpatient Hospital | Payer: Medicare Other | Attending: Internal Medicine | Admitting: Internal Medicine

## 2019-09-21 ENCOUNTER — Other Ambulatory Visit: Payer: Self-pay

## 2019-09-21 ENCOUNTER — Encounter: Payer: Self-pay | Admitting: Emergency Medicine

## 2019-09-21 ENCOUNTER — Emergency Department: Payer: Medicare Other

## 2019-09-21 ENCOUNTER — Observation Stay: Payer: Medicare Other

## 2019-09-21 DIAGNOSIS — G92 Toxic encephalopathy: Secondary | ICD-10-CM | POA: Insufficient documentation

## 2019-09-21 DIAGNOSIS — E119 Type 2 diabetes mellitus without complications: Secondary | ICD-10-CM

## 2019-09-21 DIAGNOSIS — R062 Wheezing: Secondary | ICD-10-CM

## 2019-09-21 DIAGNOSIS — N400 Enlarged prostate without lower urinary tract symptoms: Secondary | ICD-10-CM | POA: Insufficient documentation

## 2019-09-21 DIAGNOSIS — I651 Occlusion and stenosis of basilar artery: Secondary | ICD-10-CM | POA: Insufficient documentation

## 2019-09-21 DIAGNOSIS — R0689 Other abnormalities of breathing: Secondary | ICD-10-CM | POA: Diagnosis not present

## 2019-09-21 DIAGNOSIS — I6523 Occlusion and stenosis of bilateral carotid arteries: Secondary | ICD-10-CM | POA: Insufficient documentation

## 2019-09-21 DIAGNOSIS — N179 Acute kidney failure, unspecified: Secondary | ICD-10-CM | POA: Insufficient documentation

## 2019-09-21 DIAGNOSIS — I10 Essential (primary) hypertension: Secondary | ICD-10-CM | POA: Diagnosis not present

## 2019-09-21 DIAGNOSIS — Z8249 Family history of ischemic heart disease and other diseases of the circulatory system: Secondary | ICD-10-CM | POA: Insufficient documentation

## 2019-09-21 DIAGNOSIS — N401 Enlarged prostate with lower urinary tract symptoms: Secondary | ICD-10-CM | POA: Insufficient documentation

## 2019-09-21 DIAGNOSIS — Z20822 Contact with and (suspected) exposure to covid-19: Secondary | ICD-10-CM | POA: Insufficient documentation

## 2019-09-21 DIAGNOSIS — I6782 Cerebral ischemia: Secondary | ICD-10-CM | POA: Diagnosis not present

## 2019-09-21 DIAGNOSIS — F17200 Nicotine dependence, unspecified, uncomplicated: Secondary | ICD-10-CM | POA: Insufficient documentation

## 2019-09-21 DIAGNOSIS — E1159 Type 2 diabetes mellitus with other circulatory complications: Secondary | ICD-10-CM | POA: Diagnosis not present

## 2019-09-21 DIAGNOSIS — F0390 Unspecified dementia without behavioral disturbance: Secondary | ICD-10-CM | POA: Diagnosis not present

## 2019-09-21 DIAGNOSIS — Z79899 Other long term (current) drug therapy: Secondary | ICD-10-CM | POA: Insufficient documentation

## 2019-09-21 DIAGNOSIS — J341 Cyst and mucocele of nose and nasal sinus: Secondary | ICD-10-CM | POA: Insufficient documentation

## 2019-09-21 DIAGNOSIS — Z7984 Long term (current) use of oral hypoglycemic drugs: Secondary | ICD-10-CM | POA: Diagnosis not present

## 2019-09-21 DIAGNOSIS — R29810 Facial weakness: Secondary | ICD-10-CM | POA: Diagnosis not present

## 2019-09-21 DIAGNOSIS — R29818 Other symptoms and signs involving the nervous system: Secondary | ICD-10-CM | POA: Diagnosis not present

## 2019-09-21 DIAGNOSIS — I639 Cerebral infarction, unspecified: Principal | ICD-10-CM | POA: Diagnosis present

## 2019-09-21 DIAGNOSIS — Z7982 Long term (current) use of aspirin: Secondary | ICD-10-CM | POA: Insufficient documentation

## 2019-09-21 DIAGNOSIS — Z9049 Acquired absence of other specified parts of digestive tract: Secondary | ICD-10-CM | POA: Diagnosis not present

## 2019-09-21 DIAGNOSIS — Z4659 Encounter for fitting and adjustment of other gastrointestinal appliance and device: Secondary | ICD-10-CM

## 2019-09-21 DIAGNOSIS — I152 Hypertension secondary to endocrine disorders: Secondary | ICD-10-CM

## 2019-09-21 DIAGNOSIS — R531 Weakness: Secondary | ICD-10-CM | POA: Diagnosis not present

## 2019-09-21 DIAGNOSIS — R3914 Feeling of incomplete bladder emptying: Secondary | ICD-10-CM | POA: Diagnosis not present

## 2019-09-21 LAB — COMPREHENSIVE METABOLIC PANEL
ALT: 11 U/L (ref 0–44)
AST: 15 U/L (ref 15–41)
Albumin: 3.7 g/dL (ref 3.5–5.0)
Alkaline Phosphatase: 56 U/L (ref 38–126)
Anion gap: 8 (ref 5–15)
BUN: 19 mg/dL (ref 8–23)
CO2: 23 mmol/L (ref 22–32)
Calcium: 8.5 mg/dL — ABNORMAL LOW (ref 8.9–10.3)
Chloride: 107 mmol/L (ref 98–111)
Creatinine, Ser: 1.36 mg/dL — ABNORMAL HIGH (ref 0.61–1.24)
GFR calc Af Amer: 56 mL/min — ABNORMAL LOW (ref >60)
GFR calc non Af Amer: 48 mL/min — ABNORMAL LOW (ref >60)
Glucose, Bld: 164 mg/dL — ABNORMAL HIGH (ref 70–99)
Potassium: 4 mmol/L (ref 3.5–5.1)
Sodium: 138 mmol/L (ref 135–145)
Total Bilirubin: 0.8 mg/dL (ref 0.3–1.2)
Total Protein: 6.9 g/dL (ref 6.5–8.1)

## 2019-09-21 LAB — CBC
HCT: 37 % — ABNORMAL LOW (ref 39.0–52.0)
Hemoglobin: 11.9 g/dL — ABNORMAL LOW (ref 13.0–17.0)
MCH: 27.7 pg (ref 26.0–34.0)
MCHC: 32.2 g/dL (ref 30.0–36.0)
MCV: 86.2 fL (ref 80.0–100.0)
Platelets: 300 10*3/uL (ref 150–400)
RBC: 4.29 MIL/uL (ref 4.22–5.81)
RDW: 14.3 % (ref 11.5–15.5)
WBC: 10.5 10*3/uL (ref 4.0–10.5)
nRBC: 0 % (ref 0.0–0.2)

## 2019-09-21 LAB — PROTIME-INR
INR: 1.1 (ref 0.8–1.2)
Prothrombin Time: 13.5 seconds (ref 11.4–15.2)

## 2019-09-21 LAB — DIFFERENTIAL
Abs Immature Granulocytes: 0.05 10*3/uL (ref 0.00–0.07)
Basophils Absolute: 0.1 10*3/uL (ref 0.0–0.1)
Basophils Relative: 1 %
Eosinophils Absolute: 0.1 10*3/uL (ref 0.0–0.5)
Eosinophils Relative: 1 %
Immature Granulocytes: 1 %
Lymphocytes Relative: 13 %
Lymphs Abs: 1.4 10*3/uL (ref 0.7–4.0)
Monocytes Absolute: 0.7 10*3/uL (ref 0.1–1.0)
Monocytes Relative: 6 %
Neutro Abs: 8.3 10*3/uL — ABNORMAL HIGH (ref 1.7–7.7)
Neutrophils Relative %: 78 %

## 2019-09-21 LAB — APTT: aPTT: 28 seconds (ref 24–36)

## 2019-09-21 LAB — SARS CORONAVIRUS 2 BY RT PCR (HOSPITAL ORDER, PERFORMED IN ~~LOC~~ HOSPITAL LAB): SARS Coronavirus 2: NEGATIVE

## 2019-09-21 LAB — GLUCOSE, CAPILLARY: Glucose-Capillary: 134 mg/dL — ABNORMAL HIGH (ref 70–99)

## 2019-09-21 MED ORDER — INSULIN ASPART 100 UNIT/ML ~~LOC~~ SOLN
0.0000 [IU] | SUBCUTANEOUS | Status: DC
  Administered 2019-09-22: 09:00:00 4 [IU] via SUBCUTANEOUS
  Filled 2019-09-21: qty 1

## 2019-09-21 MED ORDER — ACETAMINOPHEN 160 MG/5ML PO SOLN
650.0000 mg | ORAL | Status: DC | PRN
  Filled 2019-09-21: qty 20.3

## 2019-09-21 MED ORDER — MORPHINE SULFATE (PF) 2 MG/ML IV SOLN
2.0000 mg | Freq: Once | INTRAVENOUS | Status: AC
  Administered 2019-09-21: 2 mg via INTRAVENOUS
  Filled 2019-09-21: qty 1

## 2019-09-21 MED ORDER — ACETAMINOPHEN 650 MG RE SUPP: 650.0000 mg | RECTAL | Status: DC | PRN

## 2019-09-21 MED ORDER — ENOXAPARIN SODIUM 40 MG/0.4ML ~~LOC~~ SOLN
40.0000 mg | SUBCUTANEOUS | Status: DC
  Administered 2019-09-21: 40 mg via SUBCUTANEOUS
  Filled 2019-09-21: qty 0.4

## 2019-09-21 MED ORDER — FINASTERIDE 5 MG PO TABS: 5.0000 mg | ORAL_TABLET | Freq: Every day | ORAL | Status: DC

## 2019-09-21 MED ORDER — STROKE: EARLY STAGES OF RECOVERY BOOK: Freq: Once | Status: AC

## 2019-09-21 MED ORDER — SODIUM CHLORIDE 0.9% FLUSH: 3.0000 mL | Freq: Once | INTRAVENOUS | Status: DC

## 2019-09-21 MED ORDER — ASPIRIN 81 MG PO CHEW: 81.0000 mg | CHEWABLE_TABLET | Freq: Once | ORAL | Status: DC

## 2019-09-21 MED ORDER — ATORVASTATIN CALCIUM 20 MG PO TABS: 80.0000 mg | ORAL_TABLET | Freq: Every day | ORAL | Status: DC

## 2019-09-21 MED ORDER — SENNOSIDES-DOCUSATE SODIUM 8.6-50 MG PO TABS: 1.0000 | ORAL_TABLET | Freq: Every evening | ORAL | Status: DC | PRN

## 2019-09-21 MED ORDER — ACETAMINOPHEN 325 MG PO TABS: 650.0000 mg | ORAL_TABLET | ORAL | Status: DC | PRN

## 2019-09-21 MED ORDER — CLOPIDOGREL BISULFATE 75 MG PO TABS: 75.0000 mg | ORAL_TABLET | Freq: Once | ORAL | Status: DC

## 2019-09-21 MED ORDER — METOPROLOL TARTRATE 5 MG/5ML IV SOLN
5.0000 mg | Freq: Once | INTRAVENOUS | Status: AC
  Administered 2019-09-21: 5 mg via INTRAVENOUS
  Filled 2019-09-21: qty 5

## 2019-09-21 MED ORDER — SODIUM CHLORIDE 0.9 % IV SOLN: INTRAVENOUS | Status: DC

## 2019-09-21 MED ORDER — ONDANSETRON HCL 4 MG/2ML IJ SOLN
4.0000 mg | Freq: Once | INTRAMUSCULAR | Status: AC
  Administered 2019-09-21: 4 mg via INTRAVENOUS
  Filled 2019-09-21: qty 2

## 2019-09-21 MED ORDER — ORAL CARE MOUTH RINSE
15.0000 mL | Freq: Two times a day (BID) | OROMUCOSAL | Status: DC
  Administered 2019-09-21: 20:00:00 15 mL via OROMUCOSAL

## 2019-09-21 MED ORDER — IOHEXOL 350 MG/ML SOLN
75.0000 mL | Freq: Once | INTRAVENOUS | Status: AC | PRN
  Administered 2019-09-21: 75 mL via INTRAVENOUS

## 2019-09-21 MED ORDER — TAMSULOSIN HCL 0.4 MG PO CAPS: 0.4000 mg | ORAL_CAPSULE | Freq: Every day | ORAL | Status: DC

## 2019-09-21 MED ORDER — MEMANTINE HCL 5 MG PO TABS: 5.0000 mg | ORAL_TABLET | Freq: Every day | ORAL | Status: DC

## 2019-09-21 NOTE — Progress Notes (Signed)
Neurology:  Case d/w Dr. Cinda Quest 84 y/o male presenting with dysarthria and L sided weakness. LKW last night.    CTA reviewed. 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  Pt has fetal origin of PCA( this is where I suspect he gets his posterior circulation from) as its coming from anterior circulation.    There is no Large vessel occlusion to intervene on.  - Dual anti platelet therapy ASA 81mg  and Plavix 75 mg now.  - Permissive HTN tonight - stroke work up [employer].

## 2019-09-21 NOTE — Progress Notes (Signed)
Cross Cover Brief Note Nurse reports patient with increased tachycardia and neuro change with increased right  arm weakness and now non verbal.  Is still alert and following command  No hypolycemia  Recent MRI IMPRESSION: 1. Patchy small volume acute ischemic nonhemorrhagic infarcts involving the central and right paramedian pons as well as the right cerebellum. No associated mass effect. 2. Loss of normal flow void within the basilar artery, consistent with previously identified severe vascular disease at this location as seen on prior CTA. 3. Underlying age-related cerebral atrophy with chronic small vessel ischemic disease. CTA  Head and neck IMPRESSION: CT head:  1. No evidence of acute intracranial hemorrhage. No definite demarcated cortical infarct is identified 2. Generalized parenchymal atrophy and chronic small vessel ischemic disease. 3. Mild paranasal sinus mucosal thickening. 4. Trace bilateral mastoid effusions.  CTA neck:  1. The bilateral common and internal carotid arteries are patent within the neck. There is prominent, irregular calcified plaque within the right carotid bifurcation and proximal right ICA. Exact quantification of stenosis of the proximal right ICA is difficult due to irregularity of calcified plaque at this site, but is estimated at up to 50-60% stenosis. Consider carotid artery duplex for further quantification. Mild mixed plaque is present within the left carotid bifurcation and proximal left ICA. 2. The vertebral arteries are patent within the neck bilaterally. Mild to moderate atherosclerotic narrowing at the origin of the right vertebral artery.  CTA head:  1. 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 Additionally, the basilar artery appears near occluded or occluded proximally and distally with only mild irregular flow seen within the mid basilar artery. The right posterior cerebral artery appears patent,  although this vessel is diminutive with irregular flow. Fetal origin left posterior cerebral artery which is patent without high-grade proximal stenosis. 2. Moderate/severe stenosis within an inferior division mid M2 left MCA branch vessel.   CT head rule out hemorrhagic conversion IMPRESSION: 1. Small foci of restricted diffusion involving the pons and right cerebellum on MRI are not visualized by CT. There is no hemorrhagic transformation. 2. No evidence of new ischemia. 3. Stable atrophy and chronic small vessel ischemia.  Upon return from CT, patient  was able to verbalize. Speech still garbled as prior Continue frequent neuro assessments - NIHSS  Was given 5 mg metoprolol for his heart rate. - continue close monitoring HR and BP, target systolic BP [phone]

## 2019-09-21 NOTE — Progress Notes (Signed)
NP assess pt, Metoprolol given per orders, head CT order

## 2019-09-21 NOTE — ED Triage Notes (Signed)
Pt to ED via [facility] from home for left sided weakness. Pt is unable to move arm or leg on the left side. Pt is A & O. Last known well about 14 hours ago. Pt does not have hx/o previous CVA. Per EMS pt had strong urine smell.

## 2019-09-21 NOTE — ED Notes (Signed)
Pt to radiology.

## 2019-09-21 NOTE — Progress Notes (Signed)
   09/21/19 2124  Assess: MEWS Score  Pulse Rate (!) 125  ECG Heart Rate (!) 127  Resp 18  SpO2 93 %  Assess: MEWS Score  MEWS Temp 0  MEWS Systolic 0  MEWS Pulse 2  MEWS RR 0  MEWS LOC 0  MEWS Score 2  MEWS Score Color Yellow  Assess: if the MEWS score is Yellow or Red  Were vital signs taken at a resting state? Yes  Focused Assessment Documented focused assessment  Early Detection of Sepsis Score *See Row Information* Medium  MEWS guidelines implemented *See Row Information* Yes  Treat  MEWS Interventions Other (Comment) (NP made aware)  Take Vital Signs  Increase Vital Sign Frequency  Yellow: Q 2hr X 2 then Q 4hr X 2, if remains yellow, continue Q 4hrs  Escalate  MEWS: Escalate Yellow: discuss with charge nurse/RN and consider discussing with provider and RRT  Notify: Provider  Provider Name/Title Sharion Settler NP  Date Provider Notified 09/21/19  Time Provider Notified 2126  Notification Type Page  Notification Reason Other (Comment) (HR increased)

## 2019-09-21 NOTE — Progress Notes (Signed)
Pt returned from CT, pt now able to mumble trying to speak but incomprehensible, still able to follow commands

## 2019-09-21 NOTE — Progress Notes (Signed)
   09/21/19 2140  Assess: MEWS Score  Resp (!) 21  Assess: MEWS Score  MEWS Temp 0  MEWS Systolic 0  MEWS Pulse 2  MEWS RR 1  MEWS LOC 0  MEWS Score 3  MEWS Score Color Yellow  Notify: Provider  Response See new orders  Date of Provider Response 09/21/19  Time of Provider Response 2135

## 2019-09-21 NOTE — H&P (Signed)
History and Physical    Jorge SALVA Sr. MPN:[id number] DOB: 01-Aug-1935 DOA: 09/21/2019  PCP: Cletis Athens, MD   Patient coming from: Home, independent for his ADLs.  I have personally briefly reviewed patient's old medical records in [place]  Chief Complaint: Left-sided weakness.  HPI: Jorge Schrack Pates Sr. is a 84 y.o. male with medical history significant of type 2 diabetes, hypertension, mild dementia and BPH brought to ED with complaint of left-sided weakness.  Last seen well was 14 hours before coming to ED.  Per son his mother noticed him getting out of bed around 10 AM and using the bathroom and after that he went back to bed.  Not sure when the slurred speech started.  When he came to visit him he noticed left-sided weakness with worsening slurred speech.  Patient denies any headaches or change in vision.  No chest pain or shortness of breath.  No fever or chills.  No urinary symptoms.  No recent change in his appetite or bowel habits.  No sick contacts.  ED Course: Hemodynamically stable with heart rate of 104, respiratory rate of 27, blood pressure of 142/81 and saturating well on room air.  Labs positive for mild AKI with creatinine of 1.36, baseline 1.2.  UA and COVID-19 testing pending.  CT head without any acute changes and CTA with bilaterally occluded vertebral and basilar arteries.  Patient has fetal origin of PCA and most likely getting his posterior circulation through that as it was coming from anterior circulation.  There was no large vessel occlusion to intervene on.  Neurology advised to admit for stroke work-up.  Review of Systems: As per HPI otherwise 10 point review of systems negative.   Past Medical History:  Diagnosis Date  . Diabetes mellitus without complication ([place])   . Hypertension     Past Surgical History:  Procedure Laterality Date  . CHOLECYSTECTOMY       reports that he has been smoking. He has never used smokeless tobacco. He reports  that he does not drink alcohol or use drugs.  No Known Allergies  No family history on file.  Prior to Admission medications   Medication Sig Start Date End Date Taking? Authorizing Provider  aspirin EC 81 MG tablet Take 81 mg by mouth daily.    [provider]  Cholecalciferol (VITAMIN D3) 125 MCG (5000 UT) CAPS Take 1 capsule by mouth once a week.  01/13/19   [provider]  finasteride (PROSCAR) 5 MG tablet Take 1 tablet (5 mg total) by mouth daily. 04/01/19   Hollice Espy, MD  loratadine (CLARITIN) 10 MG tablet Take 10 mg by mouth daily as needed.  10/12/18   [provider]  memantine (NAMENDA) 5 MG tablet Take 5 mg by mouth daily.  01/26/19   [provider]  metFORMIN (GLUCOPHAGE) 500 MG tablet Take 500 mg by mouth 2 (two) times daily with a meal.  11/19/18   [provider]  metoprolol tartrate (LOPRESSOR) 25 MG tablet Take 25 mg by mouth daily at 6 (six) AM.  12/28/18   [provider]  Skin Protectants, Misc. (EUCERIN) cream Apply topically as needed for dry skin.    [provider]  tadalafil (CIALIS) 5 MG tablet Take 1 tablet (5 mg total) by mouth daily as needed for erectile dysfunction. 07/01/19   Zara Council A, PA-C  tamsulosin (FLOMAX) 0.4 MG CAPS capsule Take 1 capsule (0.4 mg total) by mouth daily. 04/01/19  Hollice Espy, MD    Physical Exam: Vitals:   09/21/19 1533 09/21/19 1556 09/21/19 1600 09/21/19 1648  BP:  (!) 173/86 (!) 170/102 (!) 142/81  Pulse:  (!) 111 (!) 109 (!) 104  Resp:  (!) 21 (!) 23 (!) 27  Temp: 98.7 F (37.1 C)     TempSrc: Oral     SpO2:  97% 97% 98%  Weight:        General: Vital signs reviewed.  Patient is well-developed and well-nourished, in no acute distress and cooperative with exam.  Head: Normocephalic and atraumatic. Eyes: Right eyelid drop, conjunctivae normal, no scleral icterus.  ENMT: Mucous membranes are moist.  Neck: Supple, trachea midline, normal ROM, no  JVD, Cardiovascular: RRR, S1 normal, S2 normal, no murmurs, gallops, or rubs. Pulmonary/Chest: Clear to auscultation bilaterally, no wheezes, rales, or rhonchi. Abdominal: Soft, non-tender, non-distended, BS +, Extremities: No lower extremity edema bilaterally,  pulses symmetric and intact bilaterally. No cyanosis or clubbing. Neurological: A&O x3, slurred speech, left facial droop with left upper extremity flaccid, right upper extremity 5/5, left lower extremity 3/5 and right lower extremity 5/5. Psychiatric: Normal mood and affect.   Labs on Admission: I have personally reviewed following labs and imaging studies  CBC: Recent Labs  Lab 09/21/19 1528  WBC 10.5  NEUTROABS 8.3*  HGB 11.9*  HCT 37.0*  MCV 86.2  PLT 382   Basic Metabolic Panel: Recent Labs  Lab 09/21/19 1528  NA 138  K 4.0  CL 107  CO2 23  GLUCOSE 164*  BUN 19  CREATININE 1.36*  CALCIUM 8.5*   GFR: Estimated Creatinine Clearance: 46 mL/min (A) (by C-G formula based on SCr of 1.36 mg/dL (H)). Liver Function Tests: Recent Labs  Lab 09/21/19 1528  AST 15  ALT 11  ALKPHOS 56  BILITOT 0.8  PROT 6.9  ALBUMIN 3.7   No results for input(s): LIPASE, AMYLASE in the last 168 hours. No results for input(s): AMMONIA in the last 168 hours. Coagulation Profile: Recent Labs  Lab 09/21/19 1528  INR 1.1   Cardiac Enzymes: No results for input(s): CKTOTAL, CKMB, CKMBINDEX, TROPONINI in the last 168 hours. BNP (last 3 results) No results for input(s): PROBNP in the last 8760 hours. HbA1C: No results for input(s): HGBA1C in the last 72 hours. CBG: No results for input(s): GLUCAP in the last 168 hours. Lipid Profile: No results for input(s): CHOL, HDL, LDLCALC, TRIG, CHOLHDL, LDLDIRECT in the last 72 hours. Thyroid Function Tests: No results for input(s): TSH, T4TOTAL, FREET4, T3FREE, THYROIDAB in the last 72 hours. Anemia Panel: No results for input(s): VITAMINB12, FOLATE, FERRITIN, TIBC, IRON, RETICCTPCT  in the last 72 hours. Urine analysis:    Component Value Date/Time   APPEARANCEUR Clear 07/01/2019 1445   GLUCOSEU Negative 07/01/2019 1445   BILIRUBINUR Negative 07/01/2019 1445   PROTEINUR 1+ (A) 07/01/2019 1445   NITRITE Negative 07/01/2019 1445   LEUKOCYTESUR Negative 07/01/2019 1445    Radiological Exams on Admission: CT Angio Head W or Wo Contrast  Result Date: 09/21/2019 CLINICAL DATA:  Provided history: Patient EXAM: CT ANGIOGRAPHY HEAD AND NECK TECHNIQUE: Multidetector CT imaging of the head and neck was performed using the standard protocol during bolus administration of intravenous contrast. Multiplanar CT image reconstructions and MIPs were obtained to evaluate the vascular anatomy. Carotid stenosis measurements (when applicable) are obtained utilizing NASCET criteria, using the distal internal carotid diameter as the denominator. CONTRAST:  44mL OMNIPAQUE IOHEXOL 350 MG/ML SOLN COMPARISON:  No pertinent prior studies  available for comparison. FINDINGS: CT HEAD FINDINGS Brain: Mild generalized parenchymal atrophy. Mild ill-defined hypoattenuation within the cerebral white matter is nonspecific, but consistent with chronic small vessel ischemic disease. Prominent perivascular space versus less likely chronic lacunar in for within the inferior right basal ganglia. No definite loss of gray-white differentiation is identified. There is no acute intracranial hemorrhage. No extra-axial fluid collection. No evidence of intracranial mass. No midline shift. Vascular: Reported below. Skull: Normal. Negative for fracture or focal lesion. Sinuses: Right maxillary sinus mucous retention cyst. Mild ethmoid sinus mucosal thickening. Trace bilateral mastoid effusions. Orbits: Visualized orbits show no acute finding. Review of the MIP images confirms the above findings CTA NECK FINDINGS Aortic arch: Standard aortic branching. Atherosclerotic plaque within the visualized aortic arch and proximal major branch  vessels of the neck. No hemodynamically significant innominate or proximal subclavian artery stenosis. Right carotid system: CCA and ICA patent within the neck. Prominent calcified plaque within the carotid bifurcation and proximal ICA. Quantification of stenosis of the proximal ICA is difficult due to irregularity of calcified plaque but is estimated to be as much as 50-60%. Distal to this, the ICA is patent within the neck without significant stenosis. Left carotid system: CCA and ICA patent within the neck without significant stenosis (50% or greater). Mild mixed plaque within the carotid bifurcation and proximal ICA. Vertebral arteries: The right vertebral artery is dominant. The vertebral arteries are patent within the neck. Mild to moderate atherosclerotic narrowing at the origin the right vertebral artery. Skeleton: No acute bony abnormality or aggressive osseous lesion. Cervical spondylosis. Other neck: No neck mass or cervical lymphadenopathy Upper chest: No consolidation within the imaged lung apices. Review of the MIP images confirms the above findings CTA HEAD FINDINGS Anterior circulation: The intracranial internal carotid arteries are patent. Calcified plaque within the cavernous/paraclinoid segments bilaterally with mild stenosis. The M1 middle cerebral arteries are patent without significant stenosis. No M2 proximal branch occlusion is identified. There are sites of moderate/severe stenosis within an inferior division mid M2 left MCA branch (series 18, image 29). The anterior cerebral arteries are patent without significant proximal stenosis. Hypoplastic left A1 segment No intracranial aneurysm is identified. Posterior circulation: Both the right and left V4 vertebral arteries become occluded beyond the origin of the PICA vessels. The basilar artery appears occluded or near occluded proximally and distally with only mild irregular flow seen within the mid basilar artery. The right posterior cerebral  artery appears patent, although is diminutive in caliber with irregular flow. Fetal origin left posterior cerebral artery which is patent without high-grade proximal stenosis. The right posterior communicating artery is hypoplastic or absent. Venous sinuses: Within limitations of contrast timing, no convincing thrombus. Anatomic variants: As described. Review of the MIP images confirms the above findings These results were called by telephone at the time of interpretation on 09/21/2019 at 4:59 pm to provider [facility] , who verbally acknowledged these results. IMPRESSION: CT head: 1. No evidence of acute intracranial hemorrhage. No definite demarcated cortical infarct is identified 2. Generalized parenchymal atrophy and chronic small vessel ischemic disease. 3. Mild paranasal sinus mucosal thickening. 4. Trace bilateral mastoid effusions. CTA neck: 1. The bilateral common and internal carotid arteries are patent within the neck. There is prominent, irregular calcified plaque within the right carotid bifurcation and proximal right ICA. Exact quantification of stenosis of the proximal right ICA is difficult due to irregularity of calcified plaque at this site, but is estimated at up to 50-60% stenosis. Consider carotid artery  duplex for further quantification. Mild mixed plaque is present within the left carotid bifurcation and proximal left ICA. 2. The vertebral arteries are patent within the neck bilaterally. Mild to moderate atherosclerotic narrowing at the origin of the right vertebral artery. CTA head: 1. 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 Additionally, the basilar artery appears near occluded or occluded proximally and distally with only mild irregular flow seen within the mid basilar artery. The right posterior cerebral artery appears patent, although this vessel is diminutive with irregular flow. Fetal origin left posterior cerebral artery which is patent without high-grade proximal  stenosis. 2. Moderate/severe stenosis within an inferior division mid M2 left MCA branch vessel. Electronically Signed   By: Kellie Simmering DO   On: 09/21/2019 17:21   CT Angio Neck W and/or Wo Contrast  Result Date: 09/21/2019 CLINICAL DATA:  Provided history: Patient EXAM: CT ANGIOGRAPHY HEAD AND NECK TECHNIQUE: Multidetector CT imaging of the head and neck was performed using the standard protocol during bolus administration of intravenous contrast. Multiplanar CT image reconstructions and MIPs were obtained to evaluate the vascular anatomy. Carotid stenosis measurements (when applicable) are obtained utilizing NASCET criteria, using the distal internal carotid diameter as the denominator. CONTRAST:  74mL OMNIPAQUE IOHEXOL 350 MG/ML SOLN COMPARISON:  No pertinent prior studies available for comparison. FINDINGS: CT HEAD FINDINGS Brain: Mild generalized parenchymal atrophy. Mild ill-defined hypoattenuation within the cerebral white matter is nonspecific, but consistent with chronic small vessel ischemic disease. Prominent perivascular space versus less likely chronic lacunar in for within the inferior right basal ganglia. No definite loss of gray-white differentiation is identified. There is no acute intracranial hemorrhage. No extra-axial fluid collection. No evidence of intracranial mass. No midline shift. Vascular: Reported below. Skull: Normal. Negative for fracture or focal lesion. Sinuses: Right maxillary sinus mucous retention cyst. Mild ethmoid sinus mucosal thickening. Trace bilateral mastoid effusions. Orbits: Visualized orbits show no acute finding. Review of the MIP images confirms the above findings CTA NECK FINDINGS Aortic arch: Standard aortic branching. Atherosclerotic plaque within the visualized aortic arch and proximal major branch vessels of the neck. No hemodynamically significant innominate or proximal subclavian artery stenosis. Right carotid system: CCA and ICA patent within the neck.  Prominent calcified plaque within the carotid bifurcation and proximal ICA. Quantification of stenosis of the proximal ICA is difficult due to irregularity of calcified plaque but is estimated to be as much as 50-60%. Distal to this, the ICA is patent within the neck without significant stenosis. Left carotid system: CCA and ICA patent within the neck without significant stenosis (50% or greater). Mild mixed plaque within the carotid bifurcation and proximal ICA. Vertebral arteries: The right vertebral artery is dominant. The vertebral arteries are patent within the neck. Mild to moderate atherosclerotic narrowing at the origin the right vertebral artery. Skeleton: No acute bony abnormality or aggressive osseous lesion. Cervical spondylosis. Other neck: No neck mass or cervical lymphadenopathy Upper chest: No consolidation within the imaged lung apices. Review of the MIP images confirms the above findings CTA HEAD FINDINGS Anterior circulation: The intracranial internal carotid arteries are patent. Calcified plaque within the cavernous/paraclinoid segments bilaterally with mild stenosis. The M1 middle cerebral arteries are patent without significant stenosis. No M2 proximal branch occlusion is identified. There are sites of moderate/severe stenosis within an inferior division mid M2 left MCA branch (series 18, image 29). The anterior cerebral arteries are patent without significant proximal stenosis. Hypoplastic left A1 segment No intracranial aneurysm is identified. Posterior circulation: Both the  right and left V4 vertebral arteries become occluded beyond the origin of the PICA vessels. The basilar artery appears occluded or near occluded proximally and distally with only mild irregular flow seen within the mid basilar artery. The right posterior cerebral artery appears patent, although is diminutive in caliber with irregular flow. Fetal origin left posterior cerebral artery which is patent without high-grade  proximal stenosis. The right posterior communicating artery is hypoplastic or absent. Venous sinuses: Within limitations of contrast timing, no convincing thrombus. Anatomic variants: As described. Review of the MIP images confirms the above findings These results were called by telephone at the time of interpretation on 09/21/2019 at 4:59 pm to provider [facility] , who verbally acknowledged these results. IMPRESSION: CT head: 1. No evidence of acute intracranial hemorrhage. No definite demarcated cortical infarct is identified 2. Generalized parenchymal atrophy and chronic small vessel ischemic disease. 3. Mild paranasal sinus mucosal thickening. 4. Trace bilateral mastoid effusions. CTA neck: 1. The bilateral common and internal carotid arteries are patent within the neck. There is prominent, irregular calcified plaque within the right carotid bifurcation and proximal right ICA. Exact quantification of stenosis of the proximal right ICA is difficult due to irregularity of calcified plaque at this site, but is estimated at up to 50-60% stenosis. Consider carotid artery duplex for further quantification. Mild mixed plaque is present within the left carotid bifurcation and proximal left ICA. 2. The vertebral arteries are patent within the neck bilaterally. Mild to moderate atherosclerotic narrowing at the origin of the right vertebral artery. CTA head: 1. 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 Additionally, the basilar artery appears near occluded or occluded proximally and distally with only mild irregular flow seen within the mid basilar artery. The right posterior cerebral artery appears patent, although this vessel is diminutive with irregular flow. Fetal origin left posterior cerebral artery which is patent without high-grade proximal stenosis. 2. Moderate/severe stenosis within an inferior division mid M2 left MCA branch vessel. Electronically Signed   By: Kellie Simmering DO   On: 09/21/2019  17:21   DG Chest Portable 1 View  Result Date: 09/21/2019 CLINICAL DATA:  Left-sided weakness, CVA symptoms EXAM: PORTABLE CHEST 1 VIEW COMPARISON:  07/14/2005 FINDINGS: The heart size and mediastinal contours are within normal limits. Both lungs are clear. The visualized skeletal structures are unremarkable. IMPRESSION: No active disease. Electronically Signed   By: Randa Ngo M.D.   On: 09/21/2019 16:49    EKG: Independently reviewed.   Assessment/Plan Active Problems:   CVA (cerebral vascular accident) ([place])   CVA.  With persistent left-sided weakness, flaccid upper extremity and 3/5 lower extremity with left-sided facial droop and slurred speech. -Admit to MedSurg with telemetry. -Lipid profile -A1c -MRI -Echocardiogram -PT/OT evaluation -Swallow evaluation-patient can have diet if passed. -Continue IV fluid at 75 mL/h. -Aspirin and Plavix for first 3 weeks and then continue with Plavix as patient was already on aspirin. -Lipitor 80 mg/day.  Diabetes.  Patient was on Metformin at home. -A1c -SSI  Hypertension.  Patient was just on metoprolol.  Blood pressure mildly elevated at this time. -We will hold metoprolol for permissive hypertension. -IV labetalol for systolic above 403.  BPH.  Patient was having some difficulty with urination.  Per son he is following up with the urology. -Bladder scan-if retaining more than 500 cc then we will place Foley catheter -Continue home dose of Flomax and finasteride.  Mild dementia.  No acute concern. -Continue home dose of Namenda.  AKI.  Mild AKI with creatinine of 1.36.  Baseline around 1.2.  Appears prerenal -IV fluid. -Monitor renal function. -Avoid nephrotoxins   DVT prophylaxis: Lovenox Code Status: Full code Family Communication: Discussed with son at bedside. Disposition Plan: Home Consults called: Neurology Admission status: Observation   Lorella Nimrod MD [facility]  If 7PM-7AM, please contact  night-coverage [email or web address]  09/21/2019, 6:20 PM   This record has been created using Systems analyst. Errors have been sought and corrected,but may not always be located. Such creation errors do not reflect on the standard of care.

## 2019-09-21 NOTE — Progress Notes (Addendum)
Tech obtaining VS on pt, tech reports change in pt, upon entering room pt with eyes open, looking around but unable to speak or focus, appears weaker, still seems to be able to follow commands like squeezing my hand, NP immediately called and in the room at bedside

## 2019-09-21 NOTE — Progress Notes (Signed)
Jodie and Wes emergency contacts made aware of pts change in status, acknowledged, states understanding and are waiting for CT result

## 2019-09-21 NOTE — Progress Notes (Signed)
   09/21/19 2137  Assess: MEWS Score  BP (!) 175/77  Pulse Rate (!) 125  ECG Heart Rate (!) 120  Resp (!) 22  SpO2 95 %  Assess: MEWS Score  MEWS Temp 0  MEWS Systolic 0  MEWS Pulse 2  MEWS RR 1  MEWS LOC 0  MEWS Score 3  MEWS Score Color Yellow  Assess: if the MEWS score is Yellow or Red  Were vital signs taken at a resting state? Yes  Focused Assessment Documented focused assessment  Early Detection of Sepsis Score *See Row Information* Medium  MEWS guidelines implemented *See Row Information* Yes  Treat  MEWS Interventions Administered prn meds/treatments;Other (Comment) (NP aware and at bedside)  Take Vital Signs  Increase Vital Sign Frequency  Yellow: Q 2hr X 2 then Q 4hr X 2, if remains yellow, continue Q 4hrs  Escalate  MEWS: Escalate Yellow: discuss with charge nurse/RN and consider discussing with provider and RRT  Notify: Charge Nurse/RN  Name of Charge Nurse/RN Notified Wiliam Ke RN  Date Charge Nurse/RN Notified 09/21/19  Time Charge Nurse/RN Notified 2125  Notify: Provider  Provider Name/Title Sharion Settler NP  Date Provider Notified 09/21/19  Time Provider Notified 2137  Notification Type Page  Notification Reason Change in status

## 2019-09-21 NOTE — ED Notes (Signed)
Pt in bed resting, pt is in NAD.

## 2019-09-21 NOTE — ED Provider Notes (Signed)
[facility] Emergency Department Provider Note   ____________________________________________   First MD Initiated Contact with Patient 09/21/19 1523     (approximate)  I have reviewed the triage vital signs and the nursing notes.   HISTORY  Chief Complaint Weakness   HPI Jorge Prosser Hefferan Sr. is a 84 y.o. male who was last known well 14 hours prior to being found with left-sided weakness.  Patient cannot move his left arm left leg does not move much either.  He is having spontaneous triple flexion response when anyone goes near his left foot.  Son reports that his speech is becoming more slurry since he was found 14 hours ago.  Patient has a history of diabetes and high blood pressure.  His speech is hard to understand but is currently understandable.         Past Medical History:  Diagnosis Date  . Diabetes mellitus without complication ([place])   . Hypertension     Patient Active Problem List   Diagnosis Date Noted  . CVA (cerebral vascular accident) ([place]) 09/21/2019  . Type 2 diabetes mellitus without complication, without long-term current use of insulin ([place])   . Benign prostatic hyperplasia with incomplete bladder emptying   . Gait abnormality 09/18/2019  . Dementia with behavioral disturbance ([place]) 09/18/2019  . Seasonal allergic rhinitis due to pollen 09/18/2019  . Hypertension complicating diabetes ([place]) 09/18/2019    Past Surgical History:  Procedure Laterality Date  . CHOLECYSTECTOMY      Prior to Admission medications   Medication Sig Start Date End Date Taking? Authorizing Provider  aspirin EC 81 MG tablet Take 81 mg by mouth daily.   Yes [provider]  Cholecalciferol (VITAMIN D3) 125 MCG (5000 UT) CAPS Take 1 capsule by mouth once a week.  01/13/19  Yes [provider]  finasteride (PROSCAR) 5 MG tablet Take 1 tablet (5 mg total) by mouth daily. 04/01/19  Yes Hollice Espy, MD  memantine (NAMENDA) 5 MG tablet  Take 5 mg by mouth daily.  01/26/19  Yes [provider]  metFORMIN (GLUCOPHAGE) 500 MG tablet Take 500 mg by mouth 2 (two) times daily with a meal.  11/19/18  Yes [provider]  metoprolol tartrate (LOPRESSOR) 25 MG tablet Take 25 mg by mouth daily at 6 (six) AM.  12/28/18  Yes [provider]  tamsulosin (FLOMAX) 0.4 MG CAPS capsule Take 1 capsule (0.4 mg total) by mouth daily. 04/01/19  Yes Hollice Espy, MD  loratadine (CLARITIN) 10 MG tablet Take 10 mg by mouth daily as needed.  10/12/18   [provider]  Skin Protectants, Misc. (EUCERIN) cream Apply topically as needed for dry skin.    [provider]  tadalafil (CIALIS) 5 MG tablet Take 1 tablet (5 mg total) by mouth daily as needed for erectile dysfunction. 07/01/19   Zara Council A, PA-C    Allergies Patient has no known allergies.  History reviewed. No pertinent family history.  Social History Social History   Tobacco Use  . Smoking status: Current Every Day Smoker  . Smokeless tobacco: Never Used  Substance Use Topics  . Alcohol use: Never  . Drug use: Never    Review of Systems  Constitutional: No fever/chills Eyes: No visual changes! ENT: No sore throat. Cardiovascular: Denies chest pain. Respiratory: Denies shortness of breath. Gastrointestinal: No abdominal pain.  No nausea, no vomiting.  No diarrhea.  No constipation. Genitourinary: Negative for dysuria. Musculoskeletal: Negative for back pain. Skin:  Negative for rash. Neurological: Negative for headaches, f ____________________________________________   PHYSICAL EXAM:  VITAL SIGNS: ED Triage Vitals  Enc Vitals Group     BP 09/21/19 1530 (!) 164/95     Pulse Rate 09/21/19 1530 99     Resp 09/21/19 1530 (!) 22     Temp 09/21/19 1533 98.7 F (37.1 C)     Temp Source 09/21/19 1533 Oral     SpO2 09/21/19 1530 100 %     Weight 09/21/19 1515 205 lb 0.4 oz (93 kg)     Height --      Head Circumference --        Peak Flow --      Pain Score 09/21/19 1515 0     Pain Loc --      Pain Edu? --      Excl. in [place]? --     Constitutional: Alert and oriented.  Difficulty understanding his speech Eyes: Conjunctivae are normal. PERRL. EOMI. right eyelid is somewhat droopy Head: Atraumatic. Nose: No congestion/rhinnorhea. Mouth/Throat: Mucous membranes are moist.  Oropharynx non-erythematous. Neck: No stridor. Cardiovascular: Normal rate, regular rhythm. Grossly normal heart sounds.  Good peripheral circulation. Respiratory: Normal respiratory effort.  No retractions. Lungs CTAB. Gastrointestinal: Soft and nontender. No distention. No abdominal bruits. No CVA tenderness. Musculoskeletal: No lower extremity tenderness nor edema. Neurologic: Speech is somewhat hard to understand but is understandable.  Patient is making sense.  There is left-sided facial droop but the right eyelid is also droopy.  Patient reports left-sided numbness and left arm is immobile even with gravity removed.  Patient has spontaneous triple flexor response on any stimulation of the leg.  He cannot move the leg by himself though. Skin:  Skin is warm, dry and intact. No rash noted.   ____________________________________________   LABS (all labs ordered are listed, but only abnormal results are displayed)  Labs Reviewed  CBC - Abnormal; Notable for the following components:      Result Value   Hemoglobin 11.9 (*)    HCT 37.0 (*)    All other components within normal limits  DIFFERENTIAL - Abnormal; Notable for the following components:   Neutro Abs 8.3 (*)    All other components within normal limits  COMPREHENSIVE METABOLIC PANEL - Abnormal; Notable for the following components:   Glucose, Bld 164 (*)    Creatinine, Ser 1.36 (*)    Calcium 8.5 (*)    GFR calc non Af Amer 48 (*)    GFR calc Af Amer 56 (*)    All other components within normal limits  GLUCOSE, CAPILLARY - Abnormal; Notable for the following components:    Glucose-Capillary 134 (*)    All other components within normal limits  SARS CORONAVIRUS 2 BY RT PCR (HOSPITAL ORDER, [facility])  PROTIME-INR  APTT  URINALYSIS, COMPLETE (UACMP) WITH MICROSCOPIC  HEMOGLOBIN A1C  LIPID PANEL  BASIC METABOLIC PANEL  CBG MONITORING, ED   ____________________________________________  EKG  EKG read interpreted by me shows normal sinus rhythm rate of 100 computer thinks that the arm leads may be reversed in this could be true.  The way it is now there is a right axis.  No acute ST-T wave changes but to different looking PVCs. ____________________________________________  RADIOLOGY  ED MD interpretation:   Official radiology report(s): CT Angio Head W or Wo Contrast  Result Date: 09/21/2019 CLINICAL DATA:  Provided history: Patient EXAM: CT ANGIOGRAPHY HEAD AND NECK TECHNIQUE: Multidetector CT  imaging of the head and neck was performed using the standard protocol during bolus administration of intravenous contrast. Multiplanar CT image reconstructions and MIPs were obtained to evaluate the vascular anatomy. Carotid stenosis measurements (when applicable) are obtained utilizing NASCET criteria, using the distal internal carotid diameter as the denominator. CONTRAST:  52mL OMNIPAQUE IOHEXOL 350 MG/ML SOLN COMPARISON:  No pertinent prior studies available for comparison. FINDINGS: CT HEAD FINDINGS Brain: Mild generalized parenchymal atrophy. Mild ill-defined hypoattenuation within the cerebral white matter is nonspecific, but consistent with chronic small vessel ischemic disease. Prominent perivascular space versus less likely chronic lacunar in for within the inferior right basal ganglia. No definite loss of gray-white differentiation is identified. There is no acute intracranial hemorrhage. No extra-axial fluid collection. No evidence of intracranial mass. No midline shift. Vascular: Reported below. Skull: Normal. Negative for fracture or  focal lesion. Sinuses: Right maxillary sinus mucous retention cyst. Mild ethmoid sinus mucosal thickening. Trace bilateral mastoid effusions. Orbits: Visualized orbits show no acute finding. Review of the MIP images confirms the above findings CTA NECK FINDINGS Aortic arch: Standard aortic branching. Atherosclerotic plaque within the visualized aortic arch and proximal major branch vessels of the neck. No hemodynamically significant innominate or proximal subclavian artery stenosis. Right carotid system: CCA and ICA patent within the neck. Prominent calcified plaque within the carotid bifurcation and proximal ICA. Quantification of stenosis of the proximal ICA is difficult due to irregularity of calcified plaque but is estimated to be as much as 50-60%. Distal to this, the ICA is patent within the neck without significant stenosis. Left carotid system: CCA and ICA patent within the neck without significant stenosis (50% or greater). Mild mixed plaque within the carotid bifurcation and proximal ICA. Vertebral arteries: The right vertebral artery is dominant. The vertebral arteries are patent within the neck. Mild to moderate atherosclerotic narrowing at the origin the right vertebral artery. Skeleton: No acute bony abnormality or aggressive osseous lesion. Cervical spondylosis. Other neck: No neck mass or cervical lymphadenopathy Upper chest: No consolidation within the imaged lung apices. Review of the MIP images confirms the above findings CTA HEAD FINDINGS Anterior circulation: The intracranial internal carotid arteries are patent. Calcified plaque within the cavernous/paraclinoid segments bilaterally with mild stenosis. The M1 middle cerebral arteries are patent without significant stenosis. No M2 proximal branch occlusion is identified. There are sites of moderate/severe stenosis within an inferior division mid M2 left MCA branch (series 18, image 29). The anterior cerebral arteries are patent without  significant proximal stenosis. Hypoplastic left A1 segment No intracranial aneurysm is identified. Posterior circulation: Both the right and left V4 vertebral arteries become occluded beyond the origin of the PICA vessels. The basilar artery appears occluded or near occluded proximally and distally with only mild irregular flow seen within the mid basilar artery. The right posterior cerebral artery appears patent, although is diminutive in caliber with irregular flow. Fetal origin left posterior cerebral artery which is patent without high-grade proximal stenosis. The right posterior communicating artery is hypoplastic or absent. Venous sinuses: Within limitations of contrast timing, no convincing thrombus. Anatomic variants: As described. Review of the MIP images confirms the above findings These results were called by telephone at the time of interpretation on 09/21/2019 at 4:59 pm to provider [facility] , who verbally acknowledged these results. IMPRESSION: CT head: 1. No evidence of acute intracranial hemorrhage. No definite demarcated cortical infarct is identified 2. Generalized parenchymal atrophy and chronic small vessel ischemic disease. 3. Mild paranasal sinus mucosal thickening. 4. Trace  bilateral mastoid effusions. CTA neck: 1. The bilateral common and internal carotid arteries are patent within the neck. There is prominent, irregular calcified plaque within the right carotid bifurcation and proximal right ICA. Exact quantification of stenosis of the proximal right ICA is difficult due to irregularity of calcified plaque at this site, but is estimated at up to 50-60% stenosis. Consider carotid artery duplex for further quantification. Mild mixed plaque is present within the left carotid bifurcation and proximal left ICA. 2. The vertebral arteries are patent within the neck bilaterally. Mild to moderate atherosclerotic narrowing at the origin of the right vertebral artery. CTA head: 1. The distal V4  vertebral arteries are occluded bilaterally. Additionally, the basilar artery appears near occluded or occluded proximally and distally with only mild irregular flow seen within the mid basilar artery. The right posterior cerebral artery appears patent, although this vessel is diminutive with irregular flow. Fetal origin left posterior cerebral artery which is patent without high-grade proximal stenosis. 2. Moderate/severe stenosis within an inferior division mid M2 left MCA branch vessel. Electronically Signed   By: Kellie Simmering DO   On: 09/21/2019 17:21   CT HEAD WO CONTRAST  Result Date: 09/21/2019 CLINICAL DATA:  Worsening neuro symptoms, recent ischemic event. Evaluate for hemorrhagic conversion. EXAM: CT HEAD WITHOUT CONTRAST TECHNIQUE: Contiguous axial images were obtained from the base of the skull through the vertex without intravenous contrast. COMPARISON:  MRI 3 hours ago.  Head and neck CTA earlier today. FINDINGS: Brain: Small foci of restricted diffusion involving the pons and right cerebellum are not visualized by CT. There is no hemorrhagic transformation. No acute intraparenchymal, subarachnoid, or subdural hemorrhage. Stable degree of atrophy and chronic small vessel ischemia. No evidence of new ischemia from recent MRI. Vascular: Skull base atherosclerosis. 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 Skull: No fracture or focal lesion. Sinuses/Orbits: No acute findings. Other: None. IMPRESSION: 1. Small foci of restricted diffusion involving the pons and right cerebellum on MRI are not visualized by CT. There is no hemorrhagic transformation. 2. No evidence of new ischemia. 3. Stable atrophy and chronic small vessel ischemia. Electronically Signed   By: Keith Rake M.D.   On: 09/21/2019 22:53   CT Angio Neck W and/or Wo Contrast  Result Date: 09/21/2019 CLINICAL DATA:  Provided history: Patient EXAM: CT ANGIOGRAPHY HEAD AND NECK TECHNIQUE: Multidetector CT imaging of the head and  neck was performed using the standard protocol during bolus administration of intravenous contrast. Multiplanar CT image reconstructions and MIPs were obtained to evaluate the vascular anatomy. Carotid stenosis measurements (when applicable) are obtained utilizing NASCET criteria, using the distal internal carotid diameter as the denominator. CONTRAST:  67mL OMNIPAQUE IOHEXOL 350 MG/ML SOLN COMPARISON:  No pertinent prior studies available for comparison. FINDINGS: CT HEAD FINDINGS Brain: Mild generalized parenchymal atrophy. Mild ill-defined hypoattenuation within the cerebral white matter is nonspecific, but consistent with chronic small vessel ischemic disease. Prominent perivascular space versus less likely chronic lacunar in for within the inferior right basal ganglia. No definite loss of gray-white differentiation is identified. There is no acute intracranial hemorrhage. No extra-axial fluid collection. No evidence of intracranial mass. No midline shift. Vascular: Reported below. Skull: Normal. Negative for fracture or focal lesion. Sinuses: Right maxillary sinus mucous retention cyst. Mild ethmoid sinus mucosal thickening. Trace bilateral mastoid effusions. Orbits: Visualized orbits show no acute finding. Review of the MIP images confirms the above findings CTA NECK FINDINGS Aortic arch: Standard aortic branching. Atherosclerotic plaque within the visualized aortic arch  and proximal major branch vessels of the neck. No hemodynamically significant innominate or proximal subclavian artery stenosis. Right carotid system: CCA and ICA patent within the neck. Prominent calcified plaque within the carotid bifurcation and proximal ICA. Quantification of stenosis of the proximal ICA is difficult due to irregularity of calcified plaque but is estimated to be as much as 50-60%. Distal to this, the ICA is patent within the neck without significant stenosis. Left carotid system: CCA and ICA patent within the neck without  significant stenosis (50% or greater). Mild mixed plaque within the carotid bifurcation and proximal ICA. Vertebral arteries: The right vertebral artery is dominant. The vertebral arteries are patent within the neck. Mild to moderate atherosclerotic narrowing at the origin the right vertebral artery. Skeleton: No acute bony abnormality or aggressive osseous lesion. Cervical spondylosis. Other neck: No neck mass or cervical lymphadenopathy Upper chest: No consolidation within the imaged lung apices. Review of the MIP images confirms the above findings CTA HEAD FINDINGS Anterior circulation: The intracranial internal carotid arteries are patent. Calcified plaque within the cavernous/paraclinoid segments bilaterally with mild stenosis. The M1 middle cerebral arteries are patent without significant stenosis. No M2 proximal branch occlusion is identified. There are sites of moderate/severe stenosis within an inferior division mid M2 left MCA branch (series 18, image 29). The anterior cerebral arteries are patent without significant proximal stenosis. Hypoplastic left A1 segment No intracranial aneurysm is identified. Posterior circulation: Both the right and left V4 vertebral arteries become occluded beyond the origin of the PICA vessels. The basilar artery appears occluded or near occluded proximally and distally with only mild irregular flow seen within the mid basilar artery. The right posterior cerebral artery appears patent, although is diminutive in caliber with irregular flow. Fetal origin left posterior cerebral artery which is patent without high-grade proximal stenosis. The right posterior communicating artery is hypoplastic or absent. Venous sinuses: Within limitations of contrast timing, no convincing thrombus. Anatomic variants: As described. Review of the MIP images confirms the above findings These results were called by telephone at the time of interpretation on 09/21/2019 at 4:59 pm to provider [facility] , who verbally acknowledged these results. IMPRESSION: CT head: 1. No evidence of acute intracranial hemorrhage. No definite demarcated cortical infarct is identified 2. Generalized parenchymal atrophy and chronic small vessel ischemic disease. 3. Mild paranasal sinus mucosal thickening. 4. Trace bilateral mastoid effusions. CTA neck: 1. The bilateral common and internal carotid arteries are patent within the neck. There is prominent, irregular calcified plaque within the right carotid bifurcation and proximal right ICA. Exact quantification of stenosis of the proximal right ICA is difficult due to irregularity of calcified plaque at this site, but is estimated at up to 50-60% stenosis. Consider carotid artery duplex for further quantification. Mild mixed plaque is present within the left carotid bifurcation and proximal left ICA. 2. The vertebral arteries are patent within the neck bilaterally. Mild to moderate atherosclerotic narrowing at the origin of the right vertebral artery. CTA head: 1. 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 Additionally, the basilar artery appears near occluded or occluded proximally and distally with only mild irregular flow seen within the mid basilar artery. The right posterior cerebral artery appears patent, although this vessel is diminutive with irregular flow. Fetal origin left posterior cerebral artery which is patent without high-grade proximal stenosis. 2. Moderate/severe stenosis within an inferior division mid M2 left MCA branch vessel. Electronically Signed   By: Kellie Simmering DO   On: 09/21/2019 17:21  MR BRAIN WO CONTRAST  Result Date: 09/21/2019 CLINICAL DATA:  Initial evaluation for acute stroke. EXAM: MRI HEAD WITHOUT CONTRAST TECHNIQUE: Multiplanar, multiecho pulse sequences of the brain and surrounding structures were obtained without intravenous contrast. COMPARISON:  Prior CTA from earlier the same day. FINDINGS: Brain: Generalized  age-related cerebral atrophy. Patchy T2/FLAIR hyperintensity seen involving the periventricular deep white matter both cerebral hemispheres most consistent with chronic small vessel ischemic disease. Few scatter remote lacunar infarcts noted about the basal ganglia bilaterally. Few scattered foci of corresponding chronic blood products noted. Patchy diffusion abnormality seen involving the central and right paramedian pons, consistent with acute ischemic infarct (series 5, image 73). Additional punctate involvement of the right cerebellum (series 5, image 74). No associated hemorrhage or mass effect. No other evidence for acute or subacute ischemia. Gray-white matter differentiation otherwise maintained. No other evidence for acute or chronic intracranial hemorrhage. No mass lesion, midline shift or mass effect. No hydrocephalus or extra-axial fluid collection. Empty sella noted. Midline structures intact. Vascular: Loss of normal flow void within the basilar artery, consistent with previously identified severe vascular disease at this location as seen on prior CTA (series 10, image 5). Atherosclerotic change noted within the distal vertebral arteries as well. Major intracranial vascular flow voids otherwise maintained. Skull and upper cervical spine: Craniocervical junction within normal limits. Upper cervical spine normal. Bone marrow signal intensity within normal limits. No scalp soft tissue abnormality. Sinuses/Orbits: Globes and orbital soft tissues within normal limits. Scattered mucosal thickening in opacity noted within the ethmoidal air cells. Small right maxillary sinus retention cyst. Right mastoid effusion noted, with additional trace left mastoid effusion. Findings of doubtful significance. Visualized nasopharynx within normal limits. Other: None. IMPRESSION: 1. Patchy small volume acute ischemic nonhemorrhagic infarcts involving the central and right paramedian pons as well as the right cerebellum. No  associated mass effect. 2. Loss of normal flow void within the basilar artery, consistent with previously identified severe vascular disease at this location as seen on prior CTA. 3. Underlying age-related cerebral atrophy with chronic small vessel ischemic disease. Electronically Signed   By: Jeannine Boga M.D.   On: 09/21/2019 20:15   DG Chest Portable 1 View  Result Date: 09/21/2019 CLINICAL DATA:  Left-sided weakness, CVA symptoms EXAM: PORTABLE CHEST 1 VIEW COMPARISON:  07/14/2005 FINDINGS: The heart size and mediastinal contours are within normal limits. Both lungs are clear. The visualized skeletal structures are unremarkable. IMPRESSION: No active disease. Electronically Signed   By: Randa Ngo M.D.   On: 09/21/2019 16:49    ____________________________________________   PROCEDURES  Procedure(s) performed (including Critical Care): Critical care time half an hour.  This includes evaluating the patient talking to the patient's family member and discussing the patient twice with Dr. Irish Elders and with the radiologist twice as he called me back also to let me know when he was putting in the written CT report.  I also talked to the patient's hospital doctor and reviewed some of the old records that I could find.  Procedures   ____________________________________________   INITIAL IMPRESSION / ASSESSMENT AND PLAN / ED COURSE  Discussed the patient with Dr. Irish Elders  since patient speech is started deteriorating.  He wants me to get the angiogram in addition to the regular CT scan.  We will do this and call him back. Patient again is complaining of some pain in the left leg.  We will give him a very low dose of morphine and see if that helps.  He  failed his swallowing study.   ----------------------------------------- 11:30 PM on 09/21/2019 -----------------------------------------  Patient is now upstairs however before he went upstairs we got the CT angiogram which showed  both vertebral arteries blocked poor flow and the right PCA and the basilar artery.  There is additionally a stenosis in the M2 branch of the middle cerebral on the left.  Radiology discussed this with me.  I also discussed it in detail with Dr. Irish Elders.  Dr. Candie Mile said since there is no embolus there is nothing to do for this.  The patient is at high risk of further stroke.  We will start him on 81 mg of aspirin and 75 mg of Plavix again per Dr. Candie Mile.  I have passed this on to the hospitalist as we admitted the patient.          ____________________________________________   FINAL CLINICAL IMPRESSION(S) / ED DIAGNOSES  Final diagnoses:  Cerebrovascular accident (CVA), unspecified mechanism [facility])     ED Discharge Orders    None       Note:  This document was prepared using Dragon voice recognition software and may include unintentional dictation errors.    Nena Polio, MD 09/21/19 [phone]

## 2019-09-22 ENCOUNTER — Encounter: Payer: Self-pay | Admitting: Internal Medicine

## 2019-09-22 ENCOUNTER — Observation Stay: Payer: Medicare Other

## 2019-09-22 ENCOUNTER — Observation Stay: Admit: 2019-09-22 | Payer: Medicare Other

## 2019-09-22 ENCOUNTER — Inpatient Hospital Stay (HOSPITAL_COMMUNITY): Payer: Medicare Other

## 2019-09-22 ENCOUNTER — Inpatient Hospital Stay (HOSPITAL_COMMUNITY)
Admission: EM | Admit: 2019-09-22 | Discharge: 2019-10-16 | DRG: 064 | Disposition: E | Payer: Medicare Other | Source: Other Acute Inpatient Hospital | Attending: Neurology | Admitting: Neurology

## 2019-09-22 DIAGNOSIS — E785 Hyperlipidemia, unspecified: Secondary | ICD-10-CM | POA: Diagnosis present

## 2019-09-22 DIAGNOSIS — Z79899 Other long term (current) drug therapy: Secondary | ICD-10-CM | POA: Diagnosis not present

## 2019-09-22 DIAGNOSIS — F17200 Nicotine dependence, unspecified, uncomplicated: Secondary | ICD-10-CM | POA: Diagnosis present

## 2019-09-22 DIAGNOSIS — Z7902 Long term (current) use of antithrombotics/antiplatelets: Secondary | ICD-10-CM | POA: Diagnosis not present

## 2019-09-22 DIAGNOSIS — J9601 Acute respiratory failure with hypoxia: Secondary | ICD-10-CM | POA: Diagnosis not present

## 2019-09-22 DIAGNOSIS — N400 Enlarged prostate without lower urinary tract symptoms: Secondary | ICD-10-CM | POA: Diagnosis present

## 2019-09-22 DIAGNOSIS — D320 Benign neoplasm of cerebral meninges: Secondary | ICD-10-CM | POA: Diagnosis not present

## 2019-09-22 DIAGNOSIS — R4020 Unspecified coma: Secondary | ICD-10-CM | POA: Diagnosis not present

## 2019-09-22 DIAGNOSIS — R310 Gross hematuria: Secondary | ICD-10-CM | POA: Diagnosis not present

## 2019-09-22 DIAGNOSIS — I63011 Cerebral infarction due to thrombosis of right vertebral artery: Secondary | ICD-10-CM

## 2019-09-22 DIAGNOSIS — R402114 Coma scale, eyes open, never, 24 hours or more after hospital admission: Secondary | ICD-10-CM | POA: Diagnosis not present

## 2019-09-22 DIAGNOSIS — I63012 Cerebral infarction due to thrombosis of left vertebral artery: Secondary | ICD-10-CM

## 2019-09-22 DIAGNOSIS — R062 Wheezing: Secondary | ICD-10-CM | POA: Diagnosis not present

## 2019-09-22 DIAGNOSIS — Z66 Do not resuscitate: Secondary | ICD-10-CM | POA: Diagnosis not present

## 2019-09-22 DIAGNOSIS — R0683 Snoring: Secondary | ICD-10-CM | POA: Diagnosis not present

## 2019-09-22 DIAGNOSIS — I468 Cardiac arrest due to other underlying condition: Secondary | ICD-10-CM | POA: Diagnosis not present

## 2019-09-22 DIAGNOSIS — I651 Occlusion and stenosis of basilar artery: Secondary | ICD-10-CM

## 2019-09-22 DIAGNOSIS — Z20822 Contact with and (suspected) exposure to covid-19: Secondary | ICD-10-CM | POA: Diagnosis present

## 2019-09-22 DIAGNOSIS — Z515 Encounter for palliative care: Secondary | ICD-10-CM | POA: Diagnosis not present

## 2019-09-22 DIAGNOSIS — R471 Dysarthria and anarthria: Secondary | ICD-10-CM | POA: Diagnosis present

## 2019-09-22 DIAGNOSIS — R29810 Facial weakness: Secondary | ICD-10-CM | POA: Diagnosis present

## 2019-09-22 DIAGNOSIS — R402214 Coma scale, best verbal response, none, 24 hours or more after hospital admission: Secondary | ICD-10-CM | POA: Diagnosis not present

## 2019-09-22 DIAGNOSIS — J90 Pleural effusion, not elsewhere classified: Secondary | ICD-10-CM | POA: Diagnosis not present

## 2019-09-22 DIAGNOSIS — R1310 Dysphagia, unspecified: Secondary | ICD-10-CM | POA: Diagnosis not present

## 2019-09-22 DIAGNOSIS — I6322 Cerebral infarction due to unspecified occlusion or stenosis of basilar arteries: Principal | ICD-10-CM | POA: Diagnosis present

## 2019-09-22 DIAGNOSIS — Z978 Presence of other specified devices: Secondary | ICD-10-CM | POA: Diagnosis not present

## 2019-09-22 DIAGNOSIS — I639 Cerebral infarction, unspecified: Secondary | ICD-10-CM | POA: Diagnosis present

## 2019-09-22 DIAGNOSIS — E1151 Type 2 diabetes mellitus with diabetic peripheral angiopathy without gangrene: Secondary | ICD-10-CM | POA: Diagnosis present

## 2019-09-22 DIAGNOSIS — E119 Type 2 diabetes mellitus without complications: Secondary | ICD-10-CM

## 2019-09-22 DIAGNOSIS — Z7982 Long term (current) use of aspirin: Secondary | ICD-10-CM

## 2019-09-22 DIAGNOSIS — G8194 Hemiplegia, unspecified affecting left nondominant side: Secondary | ICD-10-CM | POA: Diagnosis not present

## 2019-09-22 DIAGNOSIS — R29730 NIHSS score 30: Secondary | ICD-10-CM | POA: Diagnosis present

## 2019-09-22 DIAGNOSIS — R4701 Aphasia: Secondary | ICD-10-CM | POA: Diagnosis not present

## 2019-09-22 DIAGNOSIS — Z4682 Encounter for fitting and adjustment of non-vascular catheter: Secondary | ICD-10-CM | POA: Diagnosis not present

## 2019-09-22 DIAGNOSIS — I6302 Cerebral infarction due to thrombosis of basilar artery: Secondary | ICD-10-CM | POA: Diagnosis not present

## 2019-09-22 DIAGNOSIS — E1159 Type 2 diabetes mellitus with other circulatory complications: Secondary | ICD-10-CM | POA: Diagnosis not present

## 2019-09-22 DIAGNOSIS — I10 Essential (primary) hypertension: Secondary | ICD-10-CM | POA: Diagnosis not present

## 2019-09-22 DIAGNOSIS — I6389 Other cerebral infarction: Secondary | ICD-10-CM | POA: Diagnosis not present

## 2019-09-22 DIAGNOSIS — Z7984 Long term (current) use of oral hypoglycemic drugs: Secondary | ICD-10-CM

## 2019-09-22 DIAGNOSIS — I63219 Cerebral infarction due to unspecified occlusion or stenosis of unspecified vertebral arteries: Secondary | ICD-10-CM | POA: Diagnosis present

## 2019-09-22 DIAGNOSIS — F0390 Unspecified dementia without behavioral disturbance: Secondary | ICD-10-CM | POA: Diagnosis present

## 2019-09-22 DIAGNOSIS — R402324 Coma scale, best motor response, extension, 24 hours or more after hospital admission: Secondary | ICD-10-CM | POA: Diagnosis not present

## 2019-09-22 LAB — URINALYSIS, COMPLETE (UACMP) WITH MICROSCOPIC
Bilirubin Urine: NEGATIVE
Glucose, UA: NEGATIVE mg/dL
Ketones, ur: 5 mg/dL — AB
Nitrite: NEGATIVE
Protein, ur: 100 mg/dL — AB
Specific Gravity, Urine: 1.033 — ABNORMAL HIGH (ref 1.005–1.030)
WBC, UA: >50 WBC/hpf — ABNORMAL HIGH (ref 0–5)
pH: 5 (ref 5.0–8.0)

## 2019-09-22 LAB — GLUCOSE, CAPILLARY
Glucose-Capillary: 123 mg/dL — ABNORMAL HIGH (ref 70–99)
Glucose-Capillary: 137 mg/dL — ABNORMAL HIGH (ref 70–99)
Glucose-Capillary: 152 mg/dL — ABNORMAL HIGH (ref 70–99)
Glucose-Capillary: 144 mg/dL — ABNORMAL HIGH (ref 70–99)
Glucose-Capillary: 123 mg/dL — ABNORMAL HIGH (ref 70–99)
Glucose-Capillary: 121 mg/dL — ABNORMAL HIGH (ref 70–99)

## 2019-09-22 LAB — CBC
HCT: 36.5 % — ABNORMAL LOW (ref 39.0–52.0)
Hemoglobin: 11.2 g/dL — ABNORMAL LOW (ref 13.0–17.0)
MCH: 27.4 pg (ref 26.0–34.0)
MCHC: 30.7 g/dL (ref 30.0–36.0)
MCV: 89.2 fL (ref 80.0–100.0)
Platelets: 259 10*3/uL (ref 150–400)
RBC: 4.09 MIL/uL — ABNORMAL LOW (ref 4.22–5.81)
RDW: 14.4 % (ref 11.5–15.5)
WBC: 11.5 10*3/uL — ABNORMAL HIGH (ref 4.0–10.5)
nRBC: 0 % (ref 0.0–0.2)

## 2019-09-22 LAB — URINALYSIS, ROUTINE W REFLEX MICROSCOPIC
Bilirubin Urine: NEGATIVE
Glucose, UA: 150 mg/dL — AB
Ketones, ur: 5 mg/dL — AB
Nitrite: NEGATIVE
Protein, ur: 100 mg/dL — AB
RBC / HPF: >50 RBC/hpf — ABNORMAL HIGH (ref 0–5)
Specific Gravity, Urine: 1.023 (ref 1.005–1.030)
Squamous Epithelial / HPF: NONE SEEN (ref 0–5)
WBC, UA: >50 WBC/hpf — ABNORMAL HIGH (ref 0–5)
pH: 5 (ref 5.0–8.0)

## 2019-09-22 LAB — BASIC METABOLIC PANEL
Anion gap: 9 (ref 5–15)
BUN: 16 mg/dL (ref 8–23)
CO2: 21 mmol/L — ABNORMAL LOW (ref 22–32)
Calcium: 8.2 mg/dL — ABNORMAL LOW (ref 8.9–10.3)
Chloride: 111 mmol/L (ref 98–111)
Creatinine, Ser: 1.21 mg/dL (ref 0.61–1.24)
GFR calc Af Amer: >60 mL/min (ref >60)
GFR calc non Af Amer: 55 mL/min — ABNORMAL LOW (ref >60)
Glucose, Bld: 145 mg/dL — ABNORMAL HIGH (ref 70–99)
Potassium: 3.6 mmol/L (ref 3.5–5.1)
Sodium: 141 mmol/L (ref 135–145)

## 2019-09-22 LAB — BLOOD GAS, ARTERIAL
Bicarbonate: 22 mmol/L (ref 20.0–28.0)
FIO2: 1
MECHVT: 500 mL
O2 Saturation: 100 %
PEEP: 5 cmH2O
RATE: 16 resp/min
pCO2 arterial: 38 mmHg (ref 32.0–48.0)
pH, Arterial: 7.37 (ref 7.350–7.450)
pO2, Arterial: 392 mmHg — ABNORMAL HIGH (ref 83.0–108.0)

## 2019-09-22 LAB — BRAIN NATRIURETIC PEPTIDE: B Natriuretic Peptide: 209.8 pg/mL — ABNORMAL HIGH (ref 0.0–100.0)

## 2019-09-22 LAB — LIPID PANEL
Cholesterol: 170 mg/dL (ref 0–200)
HDL: 32 mg/dL — ABNORMAL LOW (ref >40)
LDL Cholesterol: 118 mg/dL — ABNORMAL HIGH (ref 0–99)
Total CHOL/HDL Ratio: 5.3 RATIO
Triglycerides: 100 mg/dL (ref <150)
VLDL: 20 mg/dL (ref 0–40)

## 2019-09-22 LAB — MRSA PCR SCREENING: MRSA by PCR: NEGATIVE

## 2019-09-22 LAB — HEMOGLOBIN A1C
Hgb A1c MFr Bld: 6.7 % — ABNORMAL HIGH (ref 4.8–5.6)
Mean Plasma Glucose: 145.59 mg/dL

## 2019-09-22 LAB — ECHOCARDIOGRAM COMPLETE

## 2019-09-22 MED ORDER — MIDAZOLAM HCL 2 MG/2ML IJ SOLN
INTRAMUSCULAR | Status: AC
  Administered 2019-09-22: 10:00:00 4 mg via INTRAVENOUS
  Filled 2019-09-22: qty 4

## 2019-09-22 MED ORDER — FENTANYL CITRATE (PF) 100 MCG/2ML IJ SOLN
INTRAMUSCULAR | Status: AC
  Administered 2019-09-22: 100 ug via INTRAVENOUS
  Filled 2019-09-22: qty 2

## 2019-09-22 MED ORDER — STROKE: EARLY STAGES OF RECOVERY BOOK
Freq: Once | Status: AC
  Filled 2019-09-22: qty 1

## 2019-09-22 MED ORDER — SODIUM CHLORIDE 0.9 % IV SOLN: INTRAVENOUS | Status: DC

## 2019-09-22 MED ORDER — ACETAMINOPHEN 650 MG RE SUPP: 650.0000 mg | RECTAL | Status: DC | PRN

## 2019-09-22 MED ORDER — ATORVASTATIN CALCIUM 80 MG PO TABS: 80.0000 mg | ORAL_TABLET | Freq: Every day | ORAL | Status: AC

## 2019-09-22 MED ORDER — SENNOSIDES-DOCUSATE SODIUM 8.6-50 MG PO TABS: 1.0000 | ORAL_TABLET | Freq: Every evening | ORAL | Status: DC | PRN

## 2019-09-22 MED ORDER — STERILE WATER FOR INJECTION IJ SOLN
10.0000 mL | Freq: Once | INTRAMUSCULAR | Status: AC
  Filled 2019-09-22: qty 10

## 2019-09-22 MED ORDER — MIDAZOLAM HCL 2 MG/2ML IJ SOLN: 4.0000 mg | Freq: Once | INTRAMUSCULAR | Status: AC

## 2019-09-22 MED ORDER — INSULIN ASPART 100 UNIT/ML ~~LOC~~ SOLN
0.0000 [IU] | SUBCUTANEOUS | Status: DC
  Administered 2019-09-22 – 2019-09-24 (×8): 2 [IU] via SUBCUTANEOUS

## 2019-09-22 MED ORDER — CHLORHEXIDINE GLUCONATE CLOTH 2 % EX PADS
6.0000 | MEDICATED_PAD | Freq: Every day | CUTANEOUS | Status: DC
  Administered 2019-09-22: 6 via TOPICAL

## 2019-09-22 MED ORDER — FENTANYL 2500MCG IN NS 250ML (10MCG/ML) PREMIX INFUSION
INTRAVENOUS | Status: AC
  Administered 2019-09-22: 75 ug/h via INTRAVENOUS
  Filled 2019-09-22: qty 250

## 2019-09-22 MED ORDER — INSULIN ASPART 100 UNIT/ML ~~LOC~~ SOLN: 2.0000 [IU] | SUBCUTANEOUS | Status: DC

## 2019-09-22 MED ORDER — STERILE WATER FOR INJECTION IJ SOLN
INTRAMUSCULAR | Status: AC
  Administered 2019-09-22: 10:00:00 10 mL via INTRAMUSCULAR
  Filled 2019-09-22: qty 10

## 2019-09-22 MED ORDER — VECURONIUM BROMIDE 10 MG IV SOLR: 10.0000 mg | Freq: Once | INTRAVENOUS | Status: AC

## 2019-09-22 MED ORDER — FENTANYL CITRATE (PF) 100 MCG/2ML IJ SOLN: 100.0000 ug | Freq: Once | INTRAMUSCULAR | Status: AC

## 2019-09-22 MED ORDER — CLOPIDOGREL BISULFATE 75 MG PO TABS: 75.0000 mg | ORAL_TABLET | Freq: Once | ORAL | 0 refills | Status: AC

## 2019-09-22 MED ORDER — ENOXAPARIN SODIUM 40 MG/0.4ML ~~LOC~~ SOLN
40.0000 mg | SUBCUTANEOUS | Status: DC
  Administered 2019-09-22 – 2019-09-23 (×2): 40 mg via SUBCUTANEOUS
  Filled 2019-09-22 (×2): qty 0.4

## 2019-09-22 MED ORDER — CHLORHEXIDINE GLUCONATE 0.12% ORAL RINSE (MEDLINE KIT)
15.0000 mL | Freq: Two times a day (BID) | OROMUCOSAL | Status: DC
  Administered 2019-09-22 – 2019-09-24 (×5): 15 mL via OROMUCOSAL

## 2019-09-22 MED ORDER — FENTANYL CITRATE (PF) 100 MCG/2ML IJ SOLN: 25.0000 ug | INTRAMUSCULAR | Status: DC | PRN

## 2019-09-22 MED ORDER — IPRATROPIUM-ALBUTEROL 0.5-2.5 (3) MG/3ML IN SOLN
3.0000 mL | RESPIRATORY_TRACT | Status: AC
  Administered 2019-09-22: 3 mL via RESPIRATORY_TRACT
  Filled 2019-09-22: qty 3

## 2019-09-22 MED ORDER — PANTOPRAZOLE SODIUM 40 MG IV SOLR: 40.0000 mg | INTRAVENOUS | Status: DC

## 2019-09-22 MED ORDER — DOCUSATE SODIUM 50 MG/5ML PO LIQD: 100.0000 mg | Freq: Two times a day (BID) | ORAL | Status: DC | PRN

## 2019-09-22 MED ORDER — ACETAMINOPHEN 160 MG/5ML PO SOLN
650.0000 mg | ORAL | Status: DC | PRN
  Administered 2019-09-23 – 2019-09-24 (×2): 650 mg
  Filled 2019-09-22 (×2): qty 20.3

## 2019-09-22 MED ORDER — VECURONIUM BROMIDE 10 MG IV SOLR
INTRAVENOUS | Status: AC
  Administered 2019-09-22: 10:00:00 10 mg via INTRAVENOUS
  Filled 2019-09-22: qty 10

## 2019-09-22 MED ORDER — VITAL HIGH PROTEIN PO LIQD
1000.0000 mL | ORAL | Status: DC
  Administered 2019-09-22: 1000 mL

## 2019-09-22 MED ORDER — PANTOPRAZOLE SODIUM 40 MG PO PACK
40.0000 mg | PACK | Freq: Every day | ORAL | Status: DC
  Filled 2019-09-22: qty 20

## 2019-09-22 MED ORDER — ACETAMINOPHEN 325 MG PO TABS: 650.0000 mg | ORAL_TABLET | ORAL | Status: DC | PRN

## 2019-09-22 MED ORDER — ORAL CARE MOUTH RINSE
15.0000 mL | OROMUCOSAL | Status: DC
  Administered 2019-09-22 – 2019-09-25 (×23): 15 mL via OROMUCOSAL

## 2019-09-22 MED ORDER — FENTANYL 2500MCG IN NS 250ML (10MCG/ML) PREMIX INFUSION: 0.0000 ug/h | INTRAVENOUS | Status: DC

## 2019-09-22 MED ORDER — DEXMEDETOMIDINE HCL IN NACL 400 MCG/100ML IV SOLN
0.0000 ug/kg/h | INTRAVENOUS | Status: DC
  Administered 2019-09-22: 0.4 ug/kg/h via INTRAVENOUS
  Filled 2019-09-22: qty 100

## 2019-09-22 MED ORDER — FENTANYL 2500MCG IN NS 250ML (10MCG/ML) PREMIX INFUSION
0.0000 ug/h | INTRAVENOUS | Status: DC
  Filled 2019-09-22: qty 250

## 2019-09-22 MED ORDER — PANTOPRAZOLE SODIUM 40 MG PO PACK
40.0000 mg | PACK | Freq: Every day | ORAL | Status: DC
  Administered 2019-09-22 – 2019-09-23 (×2): 40 mg
  Filled 2019-09-22 (×3): qty 20

## 2019-09-22 NOTE — H&P (Addendum)
Neurology H&P    CC: Basilar artery occlusion  History is obtained from: Chart  HPI: Jorge CABINESS Sr. is a 84 y.o. male with history of hypertension and diabetes without complications.  Patient was brought to [facility] secondary to left-sided weakness.  Patient at that point was last seen 14 hours before coming to ED.  Per the son his mother noticed him getting out of bed around 10 AM and using the bathroom and after that he went back to bed.  When he came to visit him he noticed left-sided weakness and worsening of slurred speech.  Overnight patient had further worsening.  Patient did not receive TPA on admission secondary to being outside the window.  Initial last known well was 09/21/2019 at approximately 10 PM.  On initial exam while at [facility] per neurology, patient did not respond to verbal stimuli, did not respond to deep sternal rub, did not follow commands with no verbalizations noted.  Patient did not respond to confrontation bilaterally however pupils bilaterally were 2 mm reactive.  Eyes showed roving eye movements.  He did have a left facial droop.  He was flaccid throughout with no movements.  Did not respond to noxious stimuli.  Plantars were upgoing bilaterally.  1 hour after patient was seen, he was found to have respiratory distress with saturations in the 90s on 4 L nasal cannula.  Patient was immediately transferred to ICU.  He was intubated for airway protection.  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  Patient was immediately, transferred to [facility].   ED course [facility]  CTA neck: Bilateral common and internal carotid arteries are patent within the neck.  There is prominent, irregular calcified plaque within the right carotid bifurcation and proximal right ICA.  Exact quantification of stenosis of the proximal right ICA is difficult due  to irregularity and calcified plaque.  It is estimated to be 50 to 60% stenosis.  Vertebral arteries are patent within the neck bilaterally mild to moderate atherosclerotic narrowing at the origin of the right vertebral artery  CTA head: The distal V4 vertebral arteries are occluded bilaterally.  Additionally, the basilar artery appears near occluded or occluded proximally and distally only mildly irregular flow seen within the mid basilar artery.  The right posterior cerebral artery appears patent, although the vessel is diminutive with irregular flow.  CT head shows-small foci of restricted diffusion involving the pons and right cerebellum on MRI not visualized by CT no hemorrhagic transformation.  MRI brain-patchy volume acute ischemic nonhemorrhagic infarct involving the central and right Para median pons as well as the right cerebellum.  Loss of flow void within the basilar artery.  LKW: 10 PM on 09/21/2019 tpa given?: no, out of window Premorbid modified Rankin scale (mRS): 0 NIH stroke scale of 30  Past Medical History:  Diagnosis Date  . Diabetes mellitus without complication ([place])   . Hypertension     No family history on file and unable to obtain at this time secondary to patient being intubated.   Social History:   reports that he has been smoking. He has never used smokeless tobacco. He reports that he does not drink alcohol or use drugs.  Medications  Current Facility-Administered Medications:  .   stroke: mapping our early stages of recovery book, , Does not apply, Once, Marliss Coots, PA-C .  0.9 %  sodium chloride infusion, ,  Intravenous, Continuous, Marliss Coots, PA-C .  acetaminophen (TYLENOL) tablet 650 mg, 650 mg, Oral, Q4H PRN **OR** acetaminophen (TYLENOL) 160 MG/5ML solution 650 mg, 650 mg, Per Tube, Q4H PRN **OR** acetaminophen (TYLENOL) suppository 650 mg, 650 mg, Rectal, Q4H PRN, Marliss Coots, PA-C .  enoxaparin (LOVENOX) injection 40 mg, 40 mg,  Subcutaneous, Q24H, Marliss Coots, PA-C .  insulin aspart (novoLOG) injection 2-6 Units, 2-6 Units, Subcutaneous, Q4H, Marliss Coots, PA-C .  senna-docusate (Senokot-S) tablet 1 tablet, 1 tablet, Oral, QHS PRN, Marliss Coots, PA-C  ROS:  Unable to obtain as patient is intubated.   Exam: Current vital signs: BP (!) 158/81   Temp 97.9 F (36.6 C) (Axillary)   Resp 15   SpO2 97%  Vital signs in last 24 hours: Temp:  [97.9 F (36.6 C)-100.5 F (38.1 C)] 97.9 F (36.6 C) (06/07 1215) Pulse Rate:  [95-125] 116 (06/07 0808) Resp:  [15-31] 15 (06/07 1215) BP: (134-197)/(61-117) 158/81 (06/07 1215) SpO2:  [90 %-100 %] 97 % (06/07 1215) FiO2 (%):  [50 %-100 %] 50 % (06/07 1250) Weight:  [93 kg] 93 kg (06/06 2257)   Constitutional: Appears well-developed and well-nourished.  Eyes: No scleral injection HENT: No OP obstrucion Head: Normocephalic.  Cardiovascular: Normal rate and regular rhythm.  Respiratory: Effort normal, non-labored breathing GI: Soft.  No distension. There is no tenderness.  Skin: WDI  Neuro: Mental Status: Patient opens eyes to verbal stimuli.  Does not respond to deep sternal rub.  Initially I believe he did look cortisone for the left or right however later I noticed he was not following commands and eyes were roving.  Does not follow verbal commands.  Currently intubated.  Cranial Nerves: II: patient does not respond confrontation bilaterally,  III,IV,VI: doll's response absent bilaterally. pupils right 2 mm, left 2 mm,and reactive bilaterally V,VII: corneal reflex absent bilaterally  VIII: patient does not respond to verbal stimuli IX,X: gag reflex absent, XI: trapezius strength unable to test bilaterally XII: tongue strength unable to test Motor: Extremities flaccid throughout.  No spontaneous movement noted.  No purposeful movements noted. Sensory: Does not respond to noxious stimuli in any extremity. Deep Tendon Reflexes:  2+ upper extremities no  lower extremities Plantars: Upgoing bilaterally with triple reflex Cerebellar: Unable to perform   Labs I have reviewed labs in epic and the results pertinent to this consultation are:   CBC    Component Value Date/Time   WBC 10.5 09/21/2019 1528   RBC 4.29 09/21/2019 1528   HGB 11.9 (L) 09/21/2019 1528   HCT 37.0 (L) 09/21/2019 1528   PLT 300 09/21/2019 1528   MCV 86.2 09/21/2019 1528   MCH 27.7 09/21/2019 1528   MCHC 32.2 09/21/2019 1528   RDW 14.3 09/21/2019 1528   LYMPHSABS 1.4 09/21/2019 1528   MONOABS 0.7 09/21/2019 1528   EOSABS 0.1 09/21/2019 1528   BASOSABS 0.1 09/21/2019 1528    CMP     Component Value Date/Time   NA 141 09/20/2019 0525   NA 140 04/01/2019 1622   K 3.6 10/12/2019 0525   CL 111 10/12/2019 0525   CO2 21 (L) 10/02/2019 0525   GLUCOSE 145 (H) 09/28/2019 0525   BUN 16 09/16/2019 0525   BUN 11 04/01/2019 1622   CREATININE 1.21 10/14/2019 0525   CALCIUM 8.2 (L) 10/09/2019 0525   PROT 6.9 09/21/2019 1528   ALBUMIN 3.7 09/21/2019 1528   AST 15 09/21/2019 1528   ALT 11 09/21/2019 1528  ALKPHOS 56 09/21/2019 1528   BILITOT 0.8 09/21/2019 1528   GFRNONAA 55 (L) 09/17/2019 0525   GFRAA >60 10/14/2019 0525    Lipid Panel     Component Value Date/Time   CHOL 170 10/03/2019 0525   TRIG 100 10/10/2019 0525   HDL 32 (L) 10/14/2019 0525   CHOLHDL 5.3 10/12/2019 0525   VLDL 20 10/08/2019 0525   LDLCALC 118 (H) 10/02/2019 0525     Imaging I have reviewed the images obtained:  CTA neck: Bilateral common and internal carotid arteries are patent within the neck.  There is prominent, irregular calcified plaque within the right carotid bifurcation and proximal right ICA.  Exact quantification of stenosis of the proximal right ICA is difficult due to irregularity and calcified plaque.  It is estimated to be 50 to 60% stenosis.  Vertebral arteries are patent within the neck bilaterally mild to moderate atherosclerotic narrowing at the origin of the  right vertebral artery  CTA head: The distal V4 vertebral arteries are occluded bilaterally.  Additionally, the basilar artery appears near occluded or occluded proximally and distally only mildly irregular flow seen within the mid basilar artery.  The right posterior cerebral artery appears patent, although the vessel is diminutive with irregular flow.  CT head shows-small foci of restricted diffusion involving the pons and right cerebellum on MRI not visualized by CT no hemorrhagic transformation.  MRI brain-patchy volume acute ischemic nonhemorrhagic infarct involving the central and right Para median pons as well as the right cerebellum.  Loss of flow void within the basilar artery.   Etta Quill PA-C [facility] [phone]  M-F  (9:00 am- 5:00 PM)  10/08/2019, 12:51 PM   I have seen the patient and reviewed the above note.  He opens his eyes readily, and does look to either side.  He has minimal movement, no voluntary movement to command, to noxious stimulation he has minimal extension of the left and flexion on the right.  Assessment:  This is an unfortunate 84 year old male presenting to [place] [facility] with vertebral artery and basilar artery thrombosis.  Patient unfortunately was out of the window for TPA.  Based on his current exam, and his MRI imaging from 7 AM, I think that intervention is unlikely to benefit him at this time.  It is unclear if his occlusion is acute or chronic, but given his current exam and progression, I suspect that it was an acute on chronic process.  I tried calling his son, but got his daughter-in-law and discussed with her that I think some difficult decisions are going to need to be made over the next few days.  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  This patient is critically ill and at significant risk of neurological worsening, death and care requires constant monitoring of vital signs, hemodynamics,respiratory and cardiac monitoring, neurological assessment, discussion with family, other specialists and medical decision making of high complexity. I spent 50 minutes of neurocritical care time  in the care of  this patient. This was time spent independent of any time provided by nurse practitioner or PA.  Roland Rack, MD [facility] [phone]  If 7pm- 7am, please page neurology on call as  listed in AMION. 10/02/2019  5:00 PM

## 2019-09-22 NOTE — Progress Notes (Signed)
NP Randol Kern made aware that pt still not able to grip with right hand now, not able to follow commands now, obvious changes on the 4am and 6am assessment, and that breathing is more labored, increased and loud, states that she will come see pt again

## 2019-09-22 NOTE — Progress Notes (Signed)
NP Randol Kern made aware that family wants her to call with result of CT and MRI

## 2019-09-22 NOTE — Progress Notes (Signed)
NP Randol Kern made aware that pts pulse up in 120s now on monitor, NP wanting more recent BP, tech to get BP check

## 2019-09-22 NOTE — Discharge Summary (Signed)
Physician Discharge Summary  Jorge Vroom Radick Sr. ZJI:[id number] DOB: [birth year] DOA: 09/21/2019  PCP: Jorge Athens, MD  Admit date: 09/21/2019 Discharge date: 09/21/2019  Admitted From: Home Disposition: Jorge [facility]  Recommendations for Outpatient Follow-up:  1. Follow up with PCP in 1-2 weeks 2. Please obtain BMP/CBC in one week 3. Please follow up on the following pending results:  CODE STATUS: Full  Brief/Interim Summary:  Jorge Hickling Corbitt Sr. is a 84 y.o. male with medical history significant of type 2 diabetes, hypertension, mild dementia and BPH brought to ED with complaint of left-sided weakness.  Last seen well was 14 hours before coming to ED.  Per son his mother noticed him getting out of bed around 10 AM and using the bathroom and after that he went back to bed.  Not sure when the slurred speech started.  When he came to visit him he noticed left-sided weakness with worsening slurred speech.  Patient denies any headaches or change in vision.  No chest pain or shortness of breath.  No fever or chills.  No urinary symptoms.  No recent change in his appetite or bowel habits.  No sick contacts.  On initial exam during admission he was having slurred speech, right eyelid drooping, left facial droop, left upper extremity flaccid with 3/5 left lower extremity.  Initial CT head was negative.  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  Per initial neurology note they seems chronic and not amenable for any intervention so they advised admission with [facility] for stroke work-up.  MRI which done to midnight were with a small central and right pons infarct.  He developed worsening neurologic deficit and altered mental status earlier in the morning which prompted a repeat MRI.  Which shows progression of his infarct.  Later he developed some gurgling sounds and become unresponsive.  Not following any commands.  He was transported to ICU for intubation for airway  protection.  Dr. Leonel Ramsay at [facility] was contacted who accepted the patient for neuro ICU.  Patient was transferred to [facility] with [place].  Further management will be provided by neurology at [facility].  Discharge Diagnoses:  Active Problems:   Hypertension complicating diabetes ([place])   CVA (cerebral vascular accident) [facility])   Discharge Instructions  Discharge Instructions    Diet - low sodium heart healthy   Complete by: As directed    Increase activity slowly   Complete by: As directed      Allergies as of 10/06/2019   No Known Allergies     Medication List    TAKE these medications   aspirin EC 81 MG tablet Take 81 mg by mouth daily.   atorvastatin 80 MG tablet Commonly known as: LIPITOR Take 1 tablet (80 mg total) by mouth daily.   clopidogrel 75 MG tablet Commonly known as: PLAVIX Take 1 tablet (75 mg total) by mouth once for 1 dose.   eucerin cream Apply topically as needed for dry skin.   finasteride 5 MG tablet Commonly known as: PROSCAR Take 1 tablet (5 mg total) by mouth daily.   loratadine 10 MG tablet Commonly known as: CLARITIN Take 10 mg by mouth daily as needed.   memantine 5 MG tablet Commonly known as: NAMENDA Take 5 mg by mouth daily.   metFORMIN 500 MG tablet Commonly known as: GLUCOPHAGE Take 500 mg by mouth 2 (two) times daily with a meal.   metoprolol tartrate 25 MG tablet Commonly known as: LOPRESSOR Take  25 mg by mouth daily at 6 (six) AM.   tadalafil 5 MG tablet Commonly known as: CIALIS Take 1 tablet (5 mg total) by mouth daily as needed for erectile dysfunction.   tamsulosin 0.4 MG Caps capsule Commonly known as: Flomax Take 1 capsule (0.4 mg total) by mouth daily.   Vitamin D3 125 MCG (5000 UT) Caps Take 1 capsule by mouth once a week.       No Known Allergies  Consultations:  Neurology  Procedures/Studies: CT Angio Head W or Wo Contrast  Result Date: 09/21/2019 CLINICAL DATA:  Provided history:  Patient EXAM: CT ANGIOGRAPHY HEAD AND NECK TECHNIQUE: Multidetector CT imaging of the head and neck was performed using the standard protocol during bolus administration of intravenous contrast. Multiplanar CT image reconstructions and MIPs were obtained to evaluate the vascular anatomy. Carotid stenosis measurements (when applicable) are obtained utilizing NASCET criteria, using the distal internal carotid diameter as the denominator. CONTRAST:  87mL OMNIPAQUE IOHEXOL 350 MG/ML SOLN COMPARISON:  No pertinent prior studies available for comparison. FINDINGS: CT HEAD FINDINGS Brain: Mild generalized parenchymal atrophy. Mild ill-defined hypoattenuation within the cerebral white matter is nonspecific, but consistent with chronic small vessel ischemic disease. Prominent perivascular space versus less likely chronic lacunar in for within the inferior right basal ganglia. No definite loss of gray-white differentiation is identified. There is no acute intracranial hemorrhage. No extra-axial fluid collection. No evidence of intracranial mass. No midline shift. Vascular: Reported below. Skull: Normal. Negative for fracture or focal lesion. Sinuses: Right maxillary sinus mucous retention cyst. Mild ethmoid sinus mucosal thickening. Trace bilateral mastoid effusions. Orbits: Visualized orbits show no acute finding. Review of the MIP images confirms the above findings CTA NECK FINDINGS Aortic arch: Standard aortic branching. Atherosclerotic plaque within the visualized aortic arch and proximal major branch vessels of the neck. No hemodynamically significant innominate or proximal subclavian artery stenosis. Right carotid system: CCA and ICA patent within the neck. Prominent calcified plaque within the carotid bifurcation and proximal ICA. Quantification of stenosis of the proximal ICA is difficult due to irregularity of calcified plaque but is estimated to be as much as 50-60%. Distal to this, the ICA is patent within the neck  without significant stenosis. Left carotid system: CCA and ICA patent within the neck without significant stenosis (50% or greater). Mild mixed plaque within the carotid bifurcation and proximal ICA. Vertebral arteries: The right vertebral artery is dominant. The vertebral arteries are patent within the neck. Mild to moderate atherosclerotic narrowing at the origin the right vertebral artery. Skeleton: No acute bony abnormality or aggressive osseous lesion. Cervical spondylosis. Other neck: No neck mass or cervical lymphadenopathy Upper chest: No consolidation within the imaged lung apices. Review of the MIP images confirms the above findings CTA HEAD FINDINGS Anterior circulation: The intracranial internal carotid arteries are patent. Calcified plaque within the cavernous/paraclinoid segments bilaterally with mild stenosis. The M1 middle cerebral arteries are patent without significant stenosis. No M2 proximal branch occlusion is identified. There are sites of moderate/severe stenosis within an inferior division mid M2 left MCA branch (series 18, image 29). The anterior cerebral arteries are patent without significant proximal stenosis. Hypoplastic left A1 segment No intracranial aneurysm is identified. Posterior circulation: Both the right and left V4 vertebral arteries become occluded beyond the origin of the PICA vessels. The basilar artery appears occluded or near occluded proximally and distally with only mild irregular flow seen within the mid basilar artery. The right posterior cerebral artery appears patent, although is diminutive  in caliber with irregular flow. Fetal origin left posterior cerebral artery which is patent without high-grade proximal stenosis. The right posterior communicating artery is hypoplastic or absent. Venous sinuses: Within limitations of contrast timing, no convincing thrombus. Anatomic variants: As described. Review of the MIP images confirms the above findings These results were  called by telephone at the time of interpretation on 09/21/2019 at 4:59 pm to provider [facility] , who verbally acknowledged these results. IMPRESSION: CT head: 1. No evidence of acute intracranial hemorrhage. No definite demarcated cortical infarct is identified 2. Generalized parenchymal atrophy and chronic small vessel ischemic disease. 3. Mild paranasal sinus mucosal thickening. 4. Trace bilateral mastoid effusions. CTA neck: 1. The bilateral common and internal carotid arteries are patent within the neck. There is prominent, irregular calcified plaque within the right carotid bifurcation and proximal right ICA. Exact quantification of stenosis of the proximal right ICA is difficult due to irregularity of calcified plaque at this site, but is estimated at up to 50-60% stenosis. Consider carotid artery duplex for further quantification. Mild mixed plaque is present within the left carotid bifurcation and proximal left ICA. 2. The vertebral arteries are patent within the neck bilaterally. Mild to moderate atherosclerotic narrowing at the origin of the right vertebral artery. CTA head: 1. 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 Additionally, the basilar artery appears near occluded or occluded proximally and distally with only mild irregular flow seen within the mid basilar artery. The right posterior cerebral artery appears patent, although this vessel is diminutive with irregular flow. Fetal origin left posterior cerebral artery which is patent without high-grade proximal stenosis. 2. Moderate/severe stenosis within an inferior division mid M2 left MCA branch vessel. Electronically Signed   By: Kellie Simmering DO   On: 09/21/2019 17:21   DG Chest 1 View  Result Date: 09/29/2019 CLINICAL DATA:  Wheezing EXAM: CHEST  1 VIEW COMPARISON:  09/21/2019 FINDINGS: Low volume chest. There is no edema, consolidation, effusion, or pneumothorax. Normal heart size and mediastinal contours. Cholecystectomy  clips. IMPRESSION: Stable low volume chest. Electronically Signed   By: Monte Fantasia M.D.   On: 09/17/2019 07:05   DG Abd 1 View  Result Date: 09/20/2019 CLINICAL DATA:  Orogastric tube placement EXAM: ABDOMEN - 1 VIEW COMPARISON:  Portable exam 1003 hours compared to 01/11/2006 FINDINGS: Tip of endotracheal tube projects over pylorus or duodenal bulb. Visualized bowel gas pattern normal. LEFT basilar atelectasis. Bones demineralized. IMPRESSION: Tip of orogastric tube projects over pylorus or duodenal bulb, consider withdrawal 5 cm. Electronically Signed   By: Lavonia Dana M.D.   On: 09/26/2019 10:39   CT HEAD WO CONTRAST  Result Date: 09/21/2019 CLINICAL DATA:  Worsening neuro symptoms, recent ischemic event. Evaluate for hemorrhagic conversion. EXAM: CT HEAD WITHOUT CONTRAST TECHNIQUE: Contiguous axial images were obtained from the base of the skull through the vertex without intravenous contrast. COMPARISON:  MRI 3 hours ago.  Head and neck CTA earlier today. FINDINGS: Brain: Small foci of restricted diffusion involving the pons and right cerebellum are not visualized by CT. There is no hemorrhagic transformation. No acute intraparenchymal, subarachnoid, or subdural hemorrhage. Stable degree of atrophy and chronic small vessel ischemia. No evidence of new ischemia from recent MRI. Vascular: Skull base atherosclerosis. 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 Skull: No fracture or focal lesion. Sinuses/Orbits: No acute findings. Other: None. IMPRESSION: 1. Small foci of restricted diffusion involving the pons and right cerebellum on MRI are not visualized by CT. There is no  hemorrhagic transformation. 2. No evidence of new ischemia. 3. Stable atrophy and chronic small vessel ischemia. Electronically Signed   By: Keith Rake M.D.   On: 09/21/2019 22:53   CT Angio Neck W and/or Wo Contrast  Result Date: 09/21/2019 CLINICAL DATA:  Provided history: Patient EXAM: CT ANGIOGRAPHY HEAD AND NECK  TECHNIQUE: Multidetector CT imaging of the head and neck was performed using the standard protocol during bolus administration of intravenous contrast. Multiplanar CT image reconstructions and MIPs were obtained to evaluate the vascular anatomy. Carotid stenosis measurements (when applicable) are obtained utilizing NASCET criteria, using the distal internal carotid diameter as the denominator. CONTRAST:  4mL OMNIPAQUE IOHEXOL 350 MG/ML SOLN COMPARISON:  No pertinent prior studies available for comparison. FINDINGS: CT HEAD FINDINGS Brain: Mild generalized parenchymal atrophy. Mild ill-defined hypoattenuation within the cerebral white matter is nonspecific, but consistent with chronic small vessel ischemic disease. Prominent perivascular space versus less likely chronic lacunar in for within the inferior right basal ganglia. No definite loss of gray-white differentiation is identified. There is no acute intracranial hemorrhage. No extra-axial fluid collection. No evidence of intracranial mass. No midline shift. Vascular: Reported below. Skull: Normal. Negative for fracture or focal lesion. Sinuses: Right maxillary sinus mucous retention cyst. Mild ethmoid sinus mucosal thickening. Trace bilateral mastoid effusions. Orbits: Visualized orbits show no acute finding. Review of the MIP images confirms the above findings CTA NECK FINDINGS Aortic arch: Standard aortic branching. Atherosclerotic plaque within the visualized aortic arch and proximal major branch vessels of the neck. No hemodynamically significant innominate or proximal subclavian artery stenosis. Right carotid system: CCA and ICA patent within the neck. Prominent calcified plaque within the carotid bifurcation and proximal ICA. Quantification of stenosis of the proximal ICA is difficult due to irregularity of calcified plaque but is estimated to be as much as 50-60%. Distal to this, the ICA is patent within the neck without significant stenosis. Left carotid  system: CCA and ICA patent within the neck without significant stenosis (50% or greater). Mild mixed plaque within the carotid bifurcation and proximal ICA. Vertebral arteries: The right vertebral artery is dominant. The vertebral arteries are patent within the neck. Mild to moderate atherosclerotic narrowing at the origin the right vertebral artery. Skeleton: No acute bony abnormality or aggressive osseous lesion. Cervical spondylosis. Other neck: No neck mass or cervical lymphadenopathy Upper chest: No consolidation within the imaged lung apices. Review of the MIP images confirms the above findings CTA HEAD FINDINGS Anterior circulation: The intracranial internal carotid arteries are patent. Calcified plaque within the cavernous/paraclinoid segments bilaterally with mild stenosis. The M1 middle cerebral arteries are patent without significant stenosis. No M2 proximal branch occlusion is identified. There are sites of moderate/severe stenosis within an inferior division mid M2 left MCA branch (series 18, image 29). The anterior cerebral arteries are patent without significant proximal stenosis. Hypoplastic left A1 segment No intracranial aneurysm is identified. Posterior circulation: Both the right and left V4 vertebral arteries become occluded beyond the origin of the PICA vessels. The basilar artery appears occluded or near occluded proximally and distally with only mild irregular flow seen within the mid basilar artery. The right posterior cerebral artery appears patent, although is diminutive in caliber with irregular flow. Fetal origin left posterior cerebral artery which is patent without high-grade proximal stenosis. The right posterior communicating artery is hypoplastic or absent. Venous sinuses: Within limitations of contrast timing, no convincing thrombus. Anatomic variants: As described. Review of the MIP images confirms the above findings These results were  called by telephone at the time of  interpretation on 09/21/2019 at 4:59 pm to provider [facility] , who verbally acknowledged these results. IMPRESSION: CT head: 1. No evidence of acute intracranial hemorrhage. No definite demarcated cortical infarct is identified 2. Generalized parenchymal atrophy and chronic small vessel ischemic disease. 3. Mild paranasal sinus mucosal thickening. 4. Trace bilateral mastoid effusions. CTA neck: 1. The bilateral common and internal carotid arteries are patent within the neck. There is prominent, irregular calcified plaque within the right carotid bifurcation and proximal right ICA. Exact quantification of stenosis of the proximal right ICA is difficult due to irregularity of calcified plaque at this site, but is estimated at up to 50-60% stenosis. Consider carotid artery duplex for further quantification. Mild mixed plaque is present within the left carotid bifurcation and proximal left ICA. 2. The vertebral arteries are patent within the neck bilaterally. Mild to moderate atherosclerotic narrowing at the origin of the right vertebral artery. CTA head: 1. 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 Additionally, the basilar artery appears near occluded or occluded proximally and distally with only mild irregular flow seen within the mid basilar artery. The right posterior cerebral artery appears patent, although this vessel is diminutive with irregular flow. Fetal origin left posterior cerebral artery which is patent without high-grade proximal stenosis. 2. Moderate/severe stenosis within an inferior division mid M2 left MCA branch vessel. Electronically Signed   By: Kellie Simmering DO   On: 09/21/2019 17:21   MR BRAIN WO CONTRAST  Result Date: 09/17/2019 CLINICAL DATA:  Neuro deficit with stroke suspected. Worsening grip strength EXAM: MRI HEAD WITHOUT CONTRAST TECHNIQUE: Multiplanar, multiecho pulse sequences of the brain and surrounding structures were obtained without intravenous contrast.  COMPARISON:  Brain MRI from yesterday FINDINGS: Brain: Worsening out bilateral pontine acute infarct. Small acute infarct in the right superior cerebellum. No hemorrhage, hydrocephalus, or masslike finding. Mild for age supratentorial chronic small vessel ischemia. Vascular: Absent flow void in the right vertebral and basilar arteries, known from previous study. Skull and upper cervical spine: No focal marrow lesion Sinuses/Orbits: Retention cysts in the right maxillary sinus and partial right mastoid opacification. Please see Z vision dashboard concerning direct communication efforts. These results were called by telephone at the time of interpretation on 10/07/2019 at 8:41 am to provider Dr Reesa Chew, who verbally acknowledged these results. I recommended urgent endovascular consultation. 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 Electronically Signed   By: Monte Fantasia M.D.   On: 10/04/2019 08:42   MR BRAIN WO CONTRAST  Result Date: 09/21/2019 CLINICAL DATA:  Initial evaluation for acute stroke. EXAM: MRI HEAD WITHOUT CONTRAST TECHNIQUE: Multiplanar, multiecho pulse sequences of the brain and surrounding structures were obtained without intravenous contrast. COMPARISON:  Prior CTA from earlier the same day. FINDINGS: Brain: Generalized age-related cerebral atrophy. Patchy T2/FLAIR hyperintensity seen involving the periventricular deep white matter both cerebral hemispheres most consistent with chronic small vessel ischemic disease. Few scatter remote lacunar infarcts noted about the basal ganglia bilaterally. Few scattered foci of corresponding chronic blood products noted. Patchy diffusion abnormality seen involving the central and right paramedian pons, consistent with acute ischemic infarct (series 5, image 73). Additional punctate involvement of the right cerebellum (series 5, image 74). No associated hemorrhage or mass effect. No other  evidence for acute or subacute ischemia. Gray-white matter differentiation otherwise maintained. No other evidence for acute or chronic intracranial hemorrhage. No mass lesion, midline shift or mass effect.  No hydrocephalus or extra-axial fluid collection. Empty sella noted. Midline structures intact. Vascular: Loss of normal flow void within the basilar artery, consistent with previously identified severe vascular disease at this location as seen on prior CTA (series 10, image 5). Atherosclerotic change noted within the distal vertebral arteries as well. Major intracranial vascular flow voids otherwise maintained. Skull and upper cervical spine: Craniocervical junction within normal limits. Upper cervical spine normal. Bone marrow signal intensity within normal limits. No scalp soft tissue abnormality. Sinuses/Orbits: Globes and orbital soft tissues within normal limits. Scattered mucosal thickening in opacity noted within the ethmoidal air cells. Small right maxillary sinus retention cyst. Right mastoid effusion noted, with additional trace left mastoid effusion. Findings of doubtful significance. Visualized nasopharynx within normal limits. Other: None. IMPRESSION: 1. Patchy small volume acute ischemic nonhemorrhagic infarcts involving the central and right paramedian pons as well as the right cerebellum. No associated mass effect. 2. Loss of normal flow void within the basilar artery, consistent with previously identified severe vascular disease at this location as seen on prior CTA. 3. Underlying age-related cerebral atrophy with chronic small vessel ischemic disease. Electronically Signed   By: Jeannine Boga M.D.   On: 09/21/2019 20:15   DG Chest Port 1 View  Result Date: 09/28/2019 CLINICAL DATA:  Intubation EXAM: PORTABLE CHEST 1 VIEW COMPARISON:  Portable exam 1004 hours compared to 0636 hours FINDINGS: Tip of endotracheal tube projects 1.6 cm above carina. Nasogastric tube extends into stomach  Normal heart size, mediastinal contours, and pulmonary vascularity. Small LEFT pleural effusion and basilar atelectasis. Remaining lungs clear. No pneumothorax or acute osseous findings. IMPRESSION: Satisfactory endotracheal tube position. Small LEFT pleural effusion and LEFT basilar atelectasis. Electronically Signed   By: Lavonia Dana M.D.   On: 10/15/2019 10:38   DG Chest Portable 1 View  Result Date: 09/21/2019 CLINICAL DATA:  Left-sided weakness, CVA symptoms EXAM: PORTABLE CHEST 1 VIEW COMPARISON:  07/14/2005 FINDINGS: The heart size and mediastinal contours are within normal limits. Both lungs are clear. The visualized skeletal structures are unremarkable. IMPRESSION: No active disease. Electronically Signed   By: Randa Ngo M.D.   On: 09/21/2019 16:49     Subjective: Patient was very obtunded when seen today.  He was not following any commands.  He was not responding to painful stimuli.  Seems like having a lot of respiratory secretions.  Discharge Exam: Vitals:   09/20/2019 0935 10/01/2019 1100  BP:  134/72  Pulse:    Resp:  16  Temp:  (!) 100.4 F (38 C)  SpO2: 97% 100%   Vitals:   10/09/2019 0641 09/28/2019 0808 10/10/2019 0935 09/23/2019 1100  BP:  (!) 172/85  134/72  Pulse: (!) 102 (!) 116    Resp: (!) 25 (!) 31  16  Temp:  (!) 100.5 F (38.1 C)  (!) 100.4 F (38 C)  TempSrc:  Axillary    SpO2: 98% 90% 97% 100%  Weight:      Height:        General: Patient was unresponsive with gurgling sounds.  Not responding to any commands. Cardiovascular: RRR, S1/S2 +, no rubs, no gallops Respiratory: CTA bilaterally, no wheezing, no rhonchi Abdominal: Soft, NT, ND, bowel sounds + Extremities: no edema, no cyanosis   The results of significant diagnostics from this hospitalization (including imaging, microbiology, ancillary and laboratory) are listed below for reference.    Microbiology: Recent Results (from the past 240 hour(s))  SARS Coronavirus 2 by RT PCR (hospital order,  performed in  [facility] hospital lab) Nasopharyngeal Nasopharyngeal Swab     Status: None   Collection Time: 09/21/19  3:52 PM   Specimen: Nasopharyngeal Swab  Result Value Ref Range Status   SARS Coronavirus 2 NEGATIVE NEGATIVE Final    Comment: (NOTE) SARS-CoV-2 target nucleic acids are NOT DETECTED. The SARS-CoV-2 RNA is generally detectable in upper and lower respiratory specimens during the acute phase of infection. The lowest concentration of SARS-CoV-2 viral copies this assay can detect is 250 copies / mL. A negative result does not preclude SARS-CoV-2 infection and should not be used as the sole basis for treatment or other patient management decisions.  A negative result may occur with improper specimen collection / handling, submission of specimen other than nasopharyngeal swab, presence of viral mutation(s) within the areas targeted by this assay, and inadequate number of viral copies (<250 copies / mL). A negative result must be combined with clinical observations, patient history, and epidemiological information. Fact Sheet for Patients:   StrictlyIdeas.no Fact Sheet for Healthcare Providers: BankingDealers.co.za This test is not yet approved or cleared  by the [place] FDA and has been authorized for detection and/or diagnosis of SARS-CoV-2 by FDA under an Emergency Use Authorization (EUA).  This EUA will remain in effect (meaning this test can be used) for the duration of the COVID-19 declaration under Section 564(b)(1) of the Act, 21 U.S.C. section 360bbb-3(b)(1), unless the authorization is terminated or revoked sooner. Performed at [facility], [address] [place], [place] [address]   MRSA PCR Screening     Status: None   Collection Time: 10/07/2019 10:29 AM   Specimen: Nasopharyngeal  Result Value Ref Range Status   MRSA by PCR NEGATIVE NEGATIVE Final    Comment:        The GeneXpert MRSA Assay  (FDA approved for NASAL specimens only), is one component of a comprehensive MRSA colonization surveillance program. It is not intended to diagnose MRSA infection nor to guide or monitor treatment for MRSA infections. Performed at [facility], [address] [place], [place] [address]      Labs: BNP (last 3 results) Recent Labs    10/09/2019 0525  BNP 229.7*   Basic Metabolic Panel: Recent Labs  Lab 09/21/19 1528 10/09/2019 0525  NA 138 141  K 4.0 3.6  CL 107 111  CO2 23 21*  GLUCOSE 164* 145*  BUN 19 16  CREATININE 1.36* 1.21  CALCIUM 8.5* 8.2*   Liver Function Tests: Recent Labs  Lab 09/21/19 1528  AST 15  ALT 11  ALKPHOS 56  BILITOT 0.8  PROT 6.9  ALBUMIN 3.7   No results for input(s): LIPASE, AMYLASE in the last 168 hours. No results for input(s): AMMONIA in the last 168 hours. CBC: Recent Labs  Lab 09/21/19 1528  WBC 10.5  NEUTROABS 8.3*  HGB 11.9*  HCT 37.0*  MCV 86.2  PLT 300   Cardiac Enzymes: No results for input(s): CKTOTAL, CKMB, CKMBINDEX, TROPONINI in the last 168 hours. BNP: Invalid input(s): POCBNP CBG: Recent Labs  Lab 09/21/19 2153 09/30/2019 0128 10/04/2019 0544 09/26/2019 0815  GLUCAP 134* 123* 137* 152*   D-Dimer No results for input(s): DDIMER in the last 72 hours. Hgb A1c Recent Labs    09/21/2019 0525  HGBA1C 6.7*   Lipid Profile Recent Labs    09/30/2019 0525  CHOL 170  HDL 32*  LDLCALC 118*  TRIG 100  CHOLHDL 5.3   Thyroid function studies No results for input(s): TSH, T4TOTAL,  T3FREE, THYROIDAB in the last 72 hours.  Invalid input(s): FREET3 Anemia work up No results for input(s): VITAMINB12, FOLATE, FERRITIN, TIBC, IRON, RETICCTPCT in the last 72 hours. Urinalysis    Component Value Date/Time   COLORURINE YELLOW (A) 10/06/2019 1029   APPEARANCEUR TURBID (A) 10/08/2019 1029   APPEARANCEUR Clear 07/01/2019 1445   LABSPEC 1.023 10/12/2019 1029   PHURINE 5.0 10/10/2019 1029   GLUCOSEU 150 (A)  09/19/2019 1029   HGBUR MODERATE (A) 09/29/2019 1029   BILIRUBINUR NEGATIVE 09/26/2019 1029   BILIRUBINUR Negative 07/01/2019 1445   KETONESUR 5 (A) 09/24/2019 1029   PROTEINUR 100 (A) 10/15/2019 1029   NITRITE NEGATIVE 10/05/2019 1029   LEUKOCYTESUR SMALL (A) 10/13/2019 1029   Sepsis Labs Invalid input(s): PROCALCITONIN,  WBC,  LACTICIDVEN Microbiology Recent Results (from the past 240 hour(s))  SARS Coronavirus 2 by RT PCR (hospital order, performed in [place] hospital lab) Nasopharyngeal Nasopharyngeal Swab     Status: None   Collection Time: 09/21/19  3:52 PM   Specimen: Nasopharyngeal Swab  Result Value Ref Range Status   SARS Coronavirus 2 NEGATIVE NEGATIVE Final    Comment: (NOTE) SARS-CoV-2 target nucleic acids are NOT DETECTED. The SARS-CoV-2 RNA is generally detectable in upper and lower respiratory specimens during the acute phase of infection. The lowest concentration of SARS-CoV-2 viral copies this assay can detect is 250 copies / mL. A negative result does not preclude SARS-CoV-2 infection and should not be used as the sole basis for treatment or other patient management decisions.  A negative result may occur with improper specimen collection / handling, submission of specimen other than nasopharyngeal swab, presence of viral mutation(s) within the areas targeted by this assay, and inadequate number of viral copies (<250 copies / mL). A negative result must be combined with clinical observations, patient history, and epidemiological information. Fact Sheet for Patients:   StrictlyIdeas.no Fact Sheet for Healthcare Providers: BankingDealers.co.za This test is not yet approved or cleared  by the [place] FDA and has been authorized for detection and/or diagnosis of SARS-CoV-2 by FDA under an Emergency Use Authorization (EUA).  This EUA will remain in effect (meaning this test can be used) for the duration of  the COVID-19 declaration under Section 564(b)(1) of the Act, 21 U.S.C. section 360bbb-3(b)(1), unless the authorization is terminated or revoked sooner. Performed at [facility], [address] [place], [place] [address]   MRSA PCR Screening     Status: None   Collection Time: 10/08/2019 10:29 AM   Specimen: Nasopharyngeal  Result Value Ref Range Status   MRSA by PCR NEGATIVE NEGATIVE Final    Comment:        The GeneXpert MRSA Assay (FDA approved for NASAL specimens only), is one component of a comprehensive MRSA colonization surveillance program. It is not intended to diagnose MRSA infection nor to guide or monitor treatment for MRSA infections. Performed at [facility], [address] [place], [place] [address]     Time coordinating discharge: Over 30 minutes  SIGNED:  Lorella Nimrod, MD  [facility] 09/19/2019, 12:19 PM  If 7PM-7AM, please contact night-coverage [email or web address]  This record has been created using Systems analyst. Errors have been sought and corrected,but may not always be located. Such creation errors do not reflect on the standard of care.

## 2019-09-22 NOTE — Progress Notes (Addendum)
0800Notified MD Amin via [place]. Dr Morton Amy from [facility] radiology wants to speak to attending MD [phone] Notfified MD amin via amino that the radiologist is on hold for her please call the unit it is stat. Still awating response. Jayce.Benes paged MD via telephone. To informed her of red mews. Also sent text page via amion of vitals signs. Changed from yellow to red. awaiting respose.   0849 paged MD again.. called Rapid response on pt 0915 pt transferred to ICU

## 2019-09-22 NOTE — Procedures (Signed)
Endotracheal Intubation: Patient required placement of an artificial airway secondary to Respiratory Failure  Consent: Emergent.   Hand washing performed prior to starting the procedure.   Medications administered for sedation prior to procedure:  Midazolam 4 mg IV,  Vecuronium 10 mg IV, Fentanyl 100 mcg IV.    A time out procedure was called and correct patient, name, & ID confirmed. Needed supplies and equipment were assembled and checked to include ETT, 10 ml syringe, Glidescope, Mac and Miller blades, suction, oxygen and bag mask valve, end tidal CO2 monitor.   Patient was positioned to align the mouth and pharynx to facilitate visualization of the glottis.   Heart rate, SpO2 and blood pressure was continuously monitored during the procedure. Pre-oxygenation was conducted prior to intubation and endotracheal tube was placed through the vocal cords into the trachea.     The artificial airway was placed under direct visualization via glidescope route using a 8.0 ETT on the first attempt.  ETT was secured at 23 cm mark.  Placement was confirmed by auscuitation of lungs with good breath sounds bilaterally and no stomach sounds.  Condensation was noted on endotracheal tube.   Pulse ox 98%.  CO2 detector in place with appropriate color change.   Complications: None .   Operator: Luz Burcher.   Chest radiograph ordered and pending.   Comments: OGT placed via glidescope.  Jorge Campbell, M.D.  [facility]  Medical Director ICU-[facility] Medical Director [facility] Cardio-Pulmonary Department       

## 2019-09-22 NOTE — Progress Notes (Signed)
Pt arrived from 1c with increased neuro deficits. Upon arrival to ICU pt was unable to follow commands or communicate in anyway with significant facial drooping. Pt with sonorous respirations and concern for inability to protect airway. Pt intubated by RT with MD at the bedside with no complications. OG, foley, inserted and fentanyl infusion initiated. CXR confirmed placement of tubes and positive color change noted with end tidal Co2. Pt being transferred to [place] ICU to neuro service. Md Reesa Chew) stated she spoke with family and gave updates of plan of care.

## 2019-09-22 NOTE — Progress Notes (Signed)
Arrived to bedside, pt found to have flaccid left and right extremities, facial droop, non verbal, labored breathing, RT at bedside, RN at bedside, Charge ICU at bedside, house supervisor at bedside, Dr. Doy Mince notified of patient and arrived to bedside to assess patient, attending MD arrived to bedside and stated pt was being transferred to the Neuro ICU at [facility], pt transferred to ICU for airway managment

## 2019-09-22 NOTE — Progress Notes (Addendum)
OT Cancellation Note  Patient Details Name: Jorge Lafitte Ravenscroft Sr. MRN: [id number] DOB: 23-Apr-1935   Cancelled Treatment:    Reason Eval/Treat Not Completed: Patient not medically ready. Consult received, chart reviewed. Pt noted with worsening neuro deficits. New imaging results pending. MEWS score now 5. Will hold OT evaluation this am and re-attempt at later time as pt is medically appropriate.  Addendum, 9:15am: Pt being transferred to ICU 2/2 change in status. Will complete current order. Please re-consult if/when pt becomes medically appropriate for therapy.  Jeni Salles, MPH, MS, OTR/L ascom [phone] 10/04/2019, 8:22 AM

## 2019-09-22 NOTE — Progress Notes (Signed)
NP Randol Kern made aware that pt was woken to do 4am assessment, pt now noted to not be able to grip with the right hand and not able to hold his arm up as previously, new order to continue to monitor pt and that pt may need another MRI later, also aware of bladder scan 449ml-if greater than 500 we will place foley

## 2019-09-22 NOTE — Progress Notes (Signed)
  Echocardiogram 2D Echocardiogram has been performed.  Jorge Campbell 09/24/2019, 4:18 PM

## 2019-09-22 NOTE — Progress Notes (Signed)
Cross Cover Brief Note Patient with increased neuro deficits.  Right upper extremity now flaccid.  Patient unable to verbalize.  Opening eyes and looking to name called. Sonorous deep breathing pattern.  BBS course with crackles and expiratory wheeze.   Duo neb treatment - sats 94% on room air Chest xray - . MRI head -  Family, Jodie, notified of neuro change over the phone.  Message sent to neurololgist and attending

## 2019-09-22 NOTE — Progress Notes (Signed)
NP assessing pt.

## 2019-09-22 NOTE — Progress Notes (Signed)
   10/11/2019 0808  Vitals  Temp (!) 100.5 F (38.1 C)  Temp Source Axillary  BP (!) 172/85  MAP (mmHg) 118  BP Location Left Arm  BP Method Automatic  Patient Position (if appropriate) Lying  Pulse Rate (!) 116  Pulse Rate Source Monitor  ECG Heart Rate (!) 116  Resp (!) 31  Level of Consciousness  Level of Consciousness Alert  Oxygen Therapy  SpO2 90 %  O2 Device Room Air  MEWS Score  MEWS Temp 1  MEWS Systolic 0  MEWS Pulse 2  MEWS RR 2  MEWS LOC 0  MEWS Score 5  MEWS Score Color Red  Provider Notification  Provider Name/Title [facility]   Date Provider Notified 09/24/2019  Time Provider Notified [phone]  Notification Type Page Shea Evans )  Notification Reason Change in status  Response Other (Comment)  Rapid Response Notification  Name of Rapid Response RN Notified charlie RN   Date Rapid Response Notified 09/30/2019  Time Rapid Response Notified 0830

## 2019-09-22 NOTE — Consult Note (Addendum)
Requesting Physician: Reesa Chew    Chief Complaint: Left sided weakness  I have been asked by Dr. Reesa Chew to see this patient in consultation for acute infarct.  HPI: Jorge Newbury Braud Sr. is an 84 y.o. male who is unable to provide any history.  All history obtained from the chart.  Patient with a medical history significant of type 2 diabetes, hypertension, mild dementia and BPH brought to ED with complaint of left-sided weakness.  Last seen well was 14 hours before coming to ED.  Per son his mother noticed him getting out of bed around 10 AM and using the bathroom and after that he went back to bed.  When he came to visit him he noticed left-sided weakness with worsening slurred speech.  Overnight patient has had further worsening.   Initial NIHSS of 10.  Date last known well: 09/21/2019 Time last known well: Time: 10:00 tPA Given: No: Outside time window  Past Medical History:  Diagnosis Date  . Diabetes mellitus without complication ([place])   . Hypertension     Past Surgical History:  Procedure Laterality Date  . CHOLECYSTECTOMY      Family history: Unable to obtain due to mental status  Social History:  reports that he has been smoking. He has never used smokeless tobacco. He reports that he does not drink alcohol or use drugs.  Allergies: No Known Allergies  Medications:  I have reviewed the patient's current medications. Prior to Admission:  Medications Prior to Admission  Medication Sig Dispense Refill Last Dose  . aspirin EC 81 MG tablet Take 81 mg by mouth daily.   Past Week at Unknown time  . Cholecalciferol (VITAMIN D3) 125 MCG (5000 UT) CAPS Take 1 capsule by mouth once a week.    Past Week at Unknown time  . finasteride (PROSCAR) 5 MG tablet Take 1 tablet (5 mg total) by mouth daily. 30 tablet 11 Past Week at Unknown time  . memantine (NAMENDA) 5 MG tablet Take 5 mg by mouth daily.    Past Week at Unknown time  . metFORMIN (GLUCOPHAGE) 500 MG tablet Take 500 mg by mouth 2  (two) times daily with a meal.    Past Week at Unknown time  . metoprolol tartrate (LOPRESSOR) 25 MG tablet Take 25 mg by mouth daily at 6 (six) AM.    Past Week at Unknown time  . tamsulosin (FLOMAX) 0.4 MG CAPS capsule Take 1 capsule (0.4 mg total) by mouth daily. 30 capsule 11 Past Week at Unknown time  . loratadine (CLARITIN) 10 MG tablet Take 10 mg by mouth daily as needed.    unknown at prn  . Skin Protectants, Misc. (EUCERIN) cream Apply topically as needed for dry skin.   unknown at prn  . tadalafil (CIALIS) 5 MG tablet Take 1 tablet (5 mg total) by mouth daily as needed for erectile dysfunction. 30 tablet 1 unknown at prn   Scheduled: . aspirin  81 mg Oral Once  . atorvastatin  80 mg Oral Daily  . Chlorhexidine Gluconate Cloth  6 each Topical Daily  . clopidogrel  75 mg Oral Once  . enoxaparin (LOVENOX) injection  40 mg Subcutaneous Q24H  . finasteride  5 mg Oral Daily  . insulin aspart  0-15 Units Subcutaneous Q4H  . mouth rinse  15 mL Mouth Rinse BID  . memantine  5 mg Oral Daily  . pantoprazole sodium  40 mg Per Tube Daily  . tamsulosin  0.4 mg Oral Daily  ROS: Unable to obtain due to mental status  Physical Examination: Blood pressure (!) 172/85, pulse (!) 116, temperature (!) 100.5 F (38.1 C), temperature source Axillary, resp. rate (!) 31, height 6' (1.829 m), weight 93 kg, SpO2 97 %.  HEENT-  Normocephalic, no lesions, without obvious abnormality.  Normal external eye and conjunctiva.  Normal TM's bilaterally.  Normal auditory canals and external ears. Normal external nose, mucus membranes and septum.  Normal pharynx. Cardiovascular- S1, S2 normal, pulses palpable throughout   Lungs- chest clear, no wheezing, rales, normal symmetric air entry Abdomen- soft, non-tender; bowel sounds normal; no masses,  no organomegaly Extremities- no edema Lymph-no adenopathy palpable Musculoskeletal-no joint tenderness, deformity or swelling Skin-warm and dry, no  hyperpigmentation, vitiligo, or suspicious lesions  Neurological Examination   Mental Status: Patient does not respond to verbal stimuli.  Does not respond to deep sternal rub.  Does not follow commands.  No verbalizations noted.  Cranial Nerves: II: patient does not respond confrontation bilaterally, pupils right 2 mm, left 2 mm,and reactive bilaterally III,IV,VI: Roaming eye movements V,VII: left facial droop VIII: patient does not respond to verbal stimuli IX,X: gag reflex reduced, XI: trapezius strength unable to test bilaterally XII: tongue strength unable to test Motor: Extremities flaccid throughout.  No spontaneous movement noted.  No purposeful movements noted. Sensory: Does not respond to noxious stimuli in any extremity. Deep Tendon Reflexes:  Symmetric throughout. Plantars: upgoing bilaterally Cerebellar: Unable to perform   Laboratory Studies:  Basic Metabolic Panel: Recent Labs  Lab 09/21/19 1528 10/13/2019 0525  NA 138 141  K 4.0 3.6  CL 107 111  CO2 23 21*  GLUCOSE 164* 145*  BUN 19 16  CREATININE 1.36* 1.21  CALCIUM 8.5* 8.2*    Liver Function Tests: Recent Labs  Lab 09/21/19 1528  AST 15  ALT 11  ALKPHOS 56  BILITOT 0.8  PROT 6.9  ALBUMIN 3.7   No results for input(s): LIPASE, AMYLASE in the last 168 hours. No results for input(s): AMMONIA in the last 168 hours.  CBC: Recent Labs  Lab 09/21/19 1528  WBC 10.5  NEUTROABS 8.3*  HGB 11.9*  HCT 37.0*  MCV 86.2  PLT 300    Cardiac Enzymes: No results for input(s): CKTOTAL, CKMB, CKMBINDEX, TROPONINI in the last 168 hours.  BNP: Invalid input(s): POCBNP  CBG: Recent Labs  Lab 09/21/19 2153 10/05/2019 0128 09/17/2019 0544 09/24/2019 0815  GLUCAP 134* 123* 137* 152*    Microbiology: Results for orders placed or performed during the hospital encounter of 09/21/19  SARS Coronavirus 2 by RT PCR (hospital order, performed in [facility] hospital lab) Nasopharyngeal Nasopharyngeal Swab      Status: None   Collection Time: 09/21/19  3:52 PM   Specimen: Nasopharyngeal Swab  Result Value Ref Range Status   SARS Coronavirus 2 NEGATIVE NEGATIVE Final    Comment: (NOTE) SARS-CoV-2 target nucleic acids are NOT DETECTED. The SARS-CoV-2 RNA is generally detectable in upper and lower respiratory specimens during the acute phase of infection. The lowest concentration of SARS-CoV-2 viral copies this assay can detect is 250 copies / mL. A negative result does not preclude SARS-CoV-2 infection and should not be used as the sole basis for treatment or other patient management decisions.  A negative result may occur with improper specimen collection / handling, submission of specimen other than nasopharyngeal swab, presence of viral mutation(s) within the areas targeted by this assay, and inadequate number of viral copies (<250 copies / mL). A negative result  must be combined with clinical observations, patient history, and epidemiological information. Fact Sheet for Patients:   StrictlyIdeas.no Fact Sheet for Healthcare Providers: BankingDealers.co.za This test is not yet approved or cleared  by the [place] FDA and has been authorized for detection and/or diagnosis of SARS-CoV-2 by FDA under an Emergency Use Authorization (EUA).  This EUA will remain in effect (meaning this test can be used) for the duration of the COVID-19 declaration under Section 564(b)(1) of the Act, 21 U.S.C. section 360bbb-3(b)(1), unless the authorization is terminated or revoked sooner. Performed at [facility], [address] [place], [place] [address]     Coagulation Studies: Recent Labs    09/21/19 1528  LABPROT 13.5  INR 1.1    Urinalysis:  Recent Labs  Lab 10/04/2019 0137  COLORURINE YELLOW*  LABSPEC 1.033*  PHURINE 5.0  GLUCOSEU NEGATIVE  HGBUR SMALL*  BILIRUBINUR NEGATIVE  KETONESUR 5*  PROTEINUR 100*  NITRITE NEGATIVE   LEUKOCYTESUR LARGE*    Lipid Panel:    Component Value Date/Time   CHOL 170 10/07/2019 0525   TRIG 100 09/27/2019 0525   HDL 32 (L) 09/27/2019 0525   CHOLHDL 5.3 10/05/2019 0525   VLDL 20 09/21/2019 0525   LDLCALC 118 (H) 09/23/2019 0525    HgbA1C: No results found for: HGBA1C  Urine Drug Screen:  No results found for: LABOPIA, COCAINSCRNUR, LABBENZ, AMPHETMU, THCU, LABBARB  Alcohol Level: No results for input(s): ETH in the last 168 hours.  Other results: EKG: sinus tachycardia at 100 bpm.  Imaging: CT Angio Head W or Wo Contrast  Result Date: 09/21/2019 CLINICAL DATA:  Provided history: Patient EXAM: CT ANGIOGRAPHY HEAD AND NECK TECHNIQUE: Multidetector CT imaging of the head and neck was performed using the standard protocol during bolus administration of intravenous contrast. Multiplanar CT image reconstructions and MIPs were obtained to evaluate the vascular anatomy. Carotid stenosis measurements (when applicable) are obtained utilizing NASCET criteria, using the distal internal carotid diameter as the denominator. CONTRAST:  36mL OMNIPAQUE IOHEXOL 350 MG/ML SOLN COMPARISON:  No pertinent prior studies available for comparison. FINDINGS: CT HEAD FINDINGS Brain: Mild generalized parenchymal atrophy. Mild ill-defined hypoattenuation within the cerebral white matter is nonspecific, but consistent with chronic small vessel ischemic disease. Prominent perivascular space versus less likely chronic lacunar in for within the inferior right basal ganglia. No definite loss of gray-white differentiation is identified. There is no acute intracranial hemorrhage. No extra-axial fluid collection. No evidence of intracranial mass. No midline shift. Vascular: Reported below. Skull: Normal. Negative for fracture or focal lesion. Sinuses: Right maxillary sinus mucous retention cyst. Mild ethmoid sinus mucosal thickening. Trace bilateral mastoid effusions. Orbits: Visualized orbits show no acute finding.  Review of the MIP images confirms the above findings CTA NECK FINDINGS Aortic arch: Standard aortic branching. Atherosclerotic plaque within the visualized aortic arch and proximal major branch vessels of the neck. No hemodynamically significant innominate or proximal subclavian artery stenosis. Right carotid system: CCA and ICA patent within the neck. Prominent calcified plaque within the carotid bifurcation and proximal ICA. Quantification of stenosis of the proximal ICA is difficult due to irregularity of calcified plaque but is estimated to be as much as 50-60%. Distal to this, the ICA is patent within the neck without significant stenosis. Left carotid system: CCA and ICA patent within the neck without significant stenosis (50% or greater). Mild mixed plaque within the carotid bifurcation and proximal ICA. Vertebral arteries: The right vertebral artery is dominant. The vertebral arteries are patent within the  neck. Mild to moderate atherosclerotic narrowing at the origin the right vertebral artery. Skeleton: No acute bony abnormality or aggressive osseous lesion. Cervical spondylosis. Other neck: No neck mass or cervical lymphadenopathy Upper chest: No consolidation within the imaged lung apices. Review of the MIP images confirms the above findings CTA HEAD FINDINGS Anterior circulation: The intracranial internal carotid arteries are patent. Calcified plaque within the cavernous/paraclinoid segments bilaterally with mild stenosis. The M1 middle cerebral arteries are patent without significant stenosis. No M2 proximal branch occlusion is identified. There are sites of moderate/severe stenosis within an inferior division mid M2 left MCA branch (series 18, image 29). The anterior cerebral arteries are patent without significant proximal stenosis. Hypoplastic left A1 segment No intracranial aneurysm is identified. Posterior circulation: Both the right and left V4 vertebral arteries become occluded beyond the origin  of the PICA vessels. The basilar artery appears occluded or near occluded proximally and distally with only mild irregular flow seen within the mid basilar artery. The right posterior cerebral artery appears patent, although is diminutive in caliber with irregular flow. Fetal origin left posterior cerebral artery which is patent without high-grade proximal stenosis. The right posterior communicating artery is hypoplastic or absent. Venous sinuses: Within limitations of contrast timing, no convincing thrombus. Anatomic variants: As described. Review of the MIP images confirms the above findings These results were called by telephone at the time of interpretation on 09/21/2019 at 4:59 pm to provider [facility] , who verbally acknowledged these results. IMPRESSION: CT head: 1. No evidence of acute intracranial hemorrhage. No definite demarcated cortical infarct is identified 2. Generalized parenchymal atrophy and chronic small vessel ischemic disease. 3. Mild paranasal sinus mucosal thickening. 4. Trace bilateral mastoid effusions. CTA neck: 1. The bilateral common and internal carotid arteries are patent within the neck. There is prominent, irregular calcified plaque within the right carotid bifurcation and proximal right ICA. Exact quantification of stenosis of the proximal right ICA is difficult due to irregularity of calcified plaque at this site, but is estimated at up to 50-60% stenosis. Consider carotid artery duplex for further quantification. Mild mixed plaque is present within the left carotid bifurcation and proximal left ICA. 2. The vertebral arteries are patent within the neck bilaterally. Mild to moderate atherosclerotic narrowing at the origin of the right vertebral artery. CTA head: 1. 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 Additionally, the basilar artery appears near occluded or occluded proximally and distally with only mild irregular flow seen within the mid basilar artery. The  right posterior cerebral artery appears patent, although this vessel is diminutive with irregular flow. Fetal origin left posterior cerebral artery which is patent without high-grade proximal stenosis. 2. Moderate/severe stenosis within an inferior division mid M2 left MCA branch vessel. Electronically Signed   By: Kellie Simmering DO   On: 09/21/2019 17:21   DG Chest 1 View  Result Date: 10/08/2019 CLINICAL DATA:  Wheezing EXAM: CHEST  1 VIEW COMPARISON:  09/21/2019 FINDINGS: Low volume chest. There is no edema, consolidation, effusion, or pneumothorax. Normal heart size and mediastinal contours. Cholecystectomy clips. IMPRESSION: Stable low volume chest. Electronically Signed   By: Monte Fantasia M.D.   On: 10/08/2019 07:05   CT HEAD WO CONTRAST  Result Date: 09/21/2019 CLINICAL DATA:  Worsening neuro symptoms, recent ischemic event. Evaluate for hemorrhagic conversion. EXAM: CT HEAD WITHOUT CONTRAST TECHNIQUE: Contiguous axial images were obtained from the base of the skull through the vertex without intravenous contrast. COMPARISON:  MRI 3 hours ago.  Head  and neck CTA earlier today. FINDINGS: Brain: Small foci of restricted diffusion involving the pons and right cerebellum are not visualized by CT. There is no hemorrhagic transformation. No acute intraparenchymal, subarachnoid, or subdural hemorrhage. Stable degree of atrophy and chronic small vessel ischemia. No evidence of new ischemia from recent MRI. Vascular: Skull base atherosclerosis. 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 Skull: No fracture or focal lesion. Sinuses/Orbits: No acute findings. Other: None. IMPRESSION: 1. Small foci of restricted diffusion involving the pons and right cerebellum on MRI are not visualized by CT. There is no hemorrhagic transformation. 2. No evidence of new ischemia. 3. Stable atrophy and chronic small vessel ischemia. Electronically Signed   By: Keith Rake M.D.   On: 09/21/2019 22:53   CT Angio Neck W  and/or Wo Contrast  Result Date: 09/21/2019 CLINICAL DATA:  Provided history: Patient EXAM: CT ANGIOGRAPHY HEAD AND NECK TECHNIQUE: Multidetector CT imaging of the head and neck was performed using the standard protocol during bolus administration of intravenous contrast. Multiplanar CT image reconstructions and MIPs were obtained to evaluate the vascular anatomy. Carotid stenosis measurements (when applicable) are obtained utilizing NASCET criteria, using the distal internal carotid diameter as the denominator. CONTRAST:  52mL OMNIPAQUE IOHEXOL 350 MG/ML SOLN COMPARISON:  No pertinent prior studies available for comparison. FINDINGS: CT HEAD FINDINGS Brain: Mild generalized parenchymal atrophy. Mild ill-defined hypoattenuation within the cerebral white matter is nonspecific, but consistent with chronic small vessel ischemic disease. Prominent perivascular space versus less likely chronic lacunar in for within the inferior right basal ganglia. No definite loss of gray-white differentiation is identified. There is no acute intracranial hemorrhage. No extra-axial fluid collection. No evidence of intracranial mass. No midline shift. Vascular: Reported below. Skull: Normal. Negative for fracture or focal lesion. Sinuses: Right maxillary sinus mucous retention cyst. Mild ethmoid sinus mucosal thickening. Trace bilateral mastoid effusions. Orbits: Visualized orbits show no acute finding. Review of the MIP images confirms the above findings CTA NECK FINDINGS Aortic arch: Standard aortic branching. Atherosclerotic plaque within the visualized aortic arch and proximal major branch vessels of the neck. No hemodynamically significant innominate or proximal subclavian artery stenosis. Right carotid system: CCA and ICA patent within the neck. Prominent calcified plaque within the carotid bifurcation and proximal ICA. Quantification of stenosis of the proximal ICA is difficult due to irregularity of calcified plaque but is  estimated to be as much as 50-60%. Distal to this, the ICA is patent within the neck without significant stenosis. Left carotid system: CCA and ICA patent within the neck without significant stenosis (50% or greater). Mild mixed plaque within the carotid bifurcation and proximal ICA. Vertebral arteries: The right vertebral artery is dominant. The vertebral arteries are patent within the neck. Mild to moderate atherosclerotic narrowing at the origin the right vertebral artery. Skeleton: No acute bony abnormality or aggressive osseous lesion. Cervical spondylosis. Other neck: No neck mass or cervical lymphadenopathy Upper chest: No consolidation within the imaged lung apices. Review of the MIP images confirms the above findings CTA HEAD FINDINGS Anterior circulation: The intracranial internal carotid arteries are patent. Calcified plaque within the cavernous/paraclinoid segments bilaterally with mild stenosis. The M1 middle cerebral arteries are patent without significant stenosis. No M2 proximal branch occlusion is identified. There are sites of moderate/severe stenosis within an inferior division mid M2 left MCA branch (series 18, image 29). The anterior cerebral arteries are patent without significant proximal stenosis. Hypoplastic left A1 segment No intracranial aneurysm is identified. Posterior circulation: Both the right and  left V4 vertebral arteries become occluded beyond the origin of the PICA vessels. The basilar artery appears occluded or near occluded proximally and distally with only mild irregular flow seen within the mid basilar artery. The right posterior cerebral artery appears patent, although is diminutive in caliber with irregular flow. Fetal origin left posterior cerebral artery which is patent without high-grade proximal stenosis. The right posterior communicating artery is hypoplastic or absent. Venous sinuses: Within limitations of contrast timing, no convincing thrombus. Anatomic variants: As  described. Review of the MIP images confirms the above findings These results were called by telephone at the time of interpretation on 09/21/2019 at 4:59 pm to provider [facility] , who verbally acknowledged these results. IMPRESSION: CT head: 1. No evidence of acute intracranial hemorrhage. No definite demarcated cortical infarct is identified 2. Generalized parenchymal atrophy and chronic small vessel ischemic disease. 3. Mild paranasal sinus mucosal thickening. 4. Trace bilateral mastoid effusions. CTA neck: 1. The bilateral common and internal carotid arteries are patent within the neck. There is prominent, irregular calcified plaque within the right carotid bifurcation and proximal right ICA. Exact quantification of stenosis of the proximal right ICA is difficult due to irregularity of calcified plaque at this site, but is estimated at up to 50-60% stenosis. Consider carotid artery duplex for further quantification. Mild mixed plaque is present within the left carotid bifurcation and proximal left ICA. 2. The vertebral arteries are patent within the neck bilaterally. Mild to moderate atherosclerotic narrowing at the origin of the right vertebral artery. CTA head: 1. 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 Additionally, the basilar artery appears near occluded or occluded proximally and distally with only mild irregular flow seen within the mid basilar artery. The right posterior cerebral artery appears patent, although this vessel is diminutive with irregular flow. Fetal origin left posterior cerebral artery which is patent without high-grade proximal stenosis. 2. Moderate/severe stenosis within an inferior division mid M2 left MCA branch vessel. Electronically Signed   By: Kellie Simmering DO   On: 09/21/2019 17:21   MR BRAIN WO CONTRAST  Result Date: 10/09/2019 CLINICAL DATA:  Neuro deficit with stroke suspected. Worsening grip strength EXAM: MRI HEAD WITHOUT CONTRAST TECHNIQUE:  Multiplanar, multiecho pulse sequences of the brain and surrounding structures were obtained without intravenous contrast. COMPARISON:  Brain MRI from yesterday FINDINGS: Brain: Worsening out bilateral pontine acute infarct. Small acute infarct in the right superior cerebellum. No hemorrhage, hydrocephalus, or masslike finding. Mild for age supratentorial chronic small vessel ischemia. Vascular: Absent flow void in the right vertebral and basilar arteries, known from previous study. Skull and upper cervical spine: No focal marrow lesion Sinuses/Orbits: Retention cysts in the right maxillary sinus and partial right mastoid opacification. Please see [employer] vision dashboard concerning direct communication efforts. These results were called by telephone at the time of interpretation on 10/13/2019 at 8:41 am to provider Dr Reesa Chew, who verbally acknowledged these results. I recommended urgent endovascular consultation. 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 Electronically Signed   By: Monte Fantasia M.D.   On: 09/17/2019 08:42   MR BRAIN WO CONTRAST  Result Date: 09/21/2019 CLINICAL DATA:  Initial evaluation for acute stroke. EXAM: MRI HEAD WITHOUT CONTRAST TECHNIQUE: Multiplanar, multiecho pulse sequences of the brain and surrounding structures were obtained without intravenous contrast. COMPARISON:  Prior CTA from earlier the same day. FINDINGS: Brain: Generalized age-related cerebral atrophy. Patchy T2/FLAIR hyperintensity seen involving the periventricular deep white matter both  cerebral hemispheres most consistent with chronic small vessel ischemic disease. Few scatter remote lacunar infarcts noted about the basal ganglia bilaterally. Few scattered foci of corresponding chronic blood products noted. Patchy diffusion abnormality seen involving the central and right paramedian pons, consistent with acute ischemic infarct (series 5, image 73).  Additional punctate involvement of the right cerebellum (series 5, image 74). No associated hemorrhage or mass effect. No other evidence for acute or subacute ischemia. Gray-white matter differentiation otherwise maintained. No other evidence for acute or chronic intracranial hemorrhage. No mass lesion, midline shift or mass effect. No hydrocephalus or extra-axial fluid collection. Empty sella noted. Midline structures intact. Vascular: Loss of normal flow void within the basilar artery, consistent with previously identified severe vascular disease at this location as seen on prior CTA (series 10, image 5). Atherosclerotic change noted within the distal vertebral arteries as well. Major intracranial vascular flow voids otherwise maintained. Skull and upper cervical spine: Craniocervical junction within normal limits. Upper cervical spine normal. Bone marrow signal intensity within normal limits. No scalp soft tissue abnormality. Sinuses/Orbits: Globes and orbital soft tissues within normal limits. Scattered mucosal thickening in opacity noted within the ethmoidal air cells. Small right maxillary sinus retention cyst. Right mastoid effusion noted, with additional trace left mastoid effusion. Findings of doubtful significance. Visualized nasopharynx within normal limits. Other: None. IMPRESSION: 1. Patchy small volume acute ischemic nonhemorrhagic infarcts involving the central and right paramedian pons as well as the right cerebellum. No associated mass effect. 2. Loss of normal flow void within the basilar artery, consistent with previously identified severe vascular disease at this location as seen on prior CTA. 3. Underlying age-related cerebral atrophy with chronic small vessel ischemic disease. Electronically Signed   By: Jeannine Boga M.D.   On: 09/21/2019 20:15   DG Chest Portable 1 View  Result Date: 09/21/2019 CLINICAL DATA:  Left-sided weakness, CVA symptoms EXAM: PORTABLE CHEST 1 VIEW COMPARISON:   07/14/2005 FINDINGS: The heart size and mediastinal contours are within normal limits. Both lungs are clear. The visualized skeletal structures are unremarkable. IMPRESSION: No active disease. Electronically Signed   By: Randa Ngo M.D.   On: 09/21/2019 16:49    Assessment: 84 y.o. male with a medical history significant of type 2 diabetes, hypertension, mild dementia and BPH brought to ED with complaint of left-sided weakness.  Patient outside window for tPA.  Started on ASA and Plavix.  MRI of the brain personally reviewed from 09/21/19 and shows patchy small volume acute infarcts in the central and right paramedian pons.  Small right cerebellar infarct noted as well.   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  This morning with worsening status neurologically.  Repeat MRI of the brain personally reviewed and shows progressive, bilateral, acute pontine infarction.    Stroke Risk Factors - diabetes mellitus, hypertension and smoking  Plan: 1. Patient with issues protecting his airway.  To be transferred to the ICU and intubated.  Will then be transferred to [facility] for further care.  Dr. Leonel Ramsay contacted by the hospitalist, Dr. Reesa Chew. 2. Continue ASA and Plavix   Alexis Goodell, MD Neurology [phone] 09/16/2019, 10:05 AM

## 2019-09-22 NOTE — Consult Note (Signed)
Name: Jorge Yim Bufano Sr. 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  Patient with resp failure and unable to protect airway Patient admitted to ICU for intubation    PAST MEDICAL HISTORY :   has a past medical history of Diabetes mellitus without complication ([place]) and Hypertension.  has a past surgical history that includes Cholecystectomy. Prior to Admission medications   Medication Sig Start Date End Date Taking? Authorizing Provider  aspirin EC 81 MG tablet Take 81 mg by mouth daily.   Yes [provider]  Cholecalciferol (VITAMIN D3) 125 MCG (5000 UT) CAPS Take 1 capsule by mouth once a week.  01/13/19  Yes [provider]  finasteride (PROSCAR) 5 MG tablet Take 1 tablet (5 mg total) by mouth daily. 04/01/19  Yes Hollice Espy, MD  memantine (NAMENDA) 5 MG tablet Take 5 mg by mouth daily.  01/26/19  Yes [provider]  metFORMIN (GLUCOPHAGE) 500 MG tablet Take 500 mg by mouth 2 (two) times daily with a meal.  11/19/18  Yes [provider]  metoprolol tartrate (LOPRESSOR) 25 MG tablet Take 25 mg by mouth daily at 6 (six) AM.  12/28/18  Yes [provider]  tamsulosin (FLOMAX) 0.4 MG CAPS capsule Take 1 capsule (0.4 mg total) by mouth daily. 04/01/19  Yes Hollice Espy, MD  loratadine (CLARITIN) 10 MG tablet Take 10 mg by mouth daily as needed.  10/12/18   [provider]  Skin Protectants, Misc. (EUCERIN) cream Apply topically as needed for dry skin.    [provider]  tadalafil (CIALIS) 5 MG tablet Take 1 tablet (5 mg total) by  mouth daily as needed for erectile dysfunction. 07/01/19   Zara Council A, PA-C   No Known Allergies  FAMILY HISTORY:  HTN  SOCIAL HISTORY:  reports that he has been smoking. He has never used smokeless tobacco. He reports that he does not drink alcohol or use drugs.  REVIEW OF SYSTEMS:   Unable to obtain due to critical illness      Estimated body mass index is 27.81 kg/m as calculated from the following:   Height as of this encounter: 6' (1.829 m).   Weight as of this encounter: 93 kg.    VITAL SIGNS: Temp:  [98.7 F (37.1 C)-100.5 F (38.1 C)] 100.5 F (38.1 C) (06/07 0808) Pulse Rate:  [95-125] 116 (06/07 0808) Resp:  [15-31] 31 (06/07 0808) BP: (142-197)/(61-117) 172/85 (06/07 0808) SpO2:  [90 %-100 %] 90 % (06/07 0808) Weight:  [93 kg] 93 kg (06/06 2257)   I/O last 3 completed shifts: In: 679.7 [I.V.:679.7] Out: 300 [Urine:300] No intake/output data recorded.   SpO2: 90 % O2 Flow Rate (L/min): 2 L/min   Physical Examination:  GENERAL:critically ill appearing, +resp distress HEAD: Normocephalic, atraumatic.  EYES: Pupils equal, round, reactive to light.  No scleral icterus.  MOUTH: Moist mucosal membrane. NECK: Supple. No JVD.  PULMONARY: +rhonchi, +wheezing CARDIOVASCULAR: S1 and S2. Regular rate and rhythm. No murmurs, rubs, or gallops.  GASTROINTESTINAL: Soft, nontender, -distended.  Positive bowel sounds.  MUSCULOSKELETAL: No swelling, clubbing, or edema.  NEUROLOGIC:  obtunded SKIN:intact,warm,dry  MEDICATIONS: I have reviewed all medications and confirmed regimen as documented   CULTURE RESULTS   Recent Results (from the past 240 hour(s))  SARS Coronavirus 2 by RT PCR (hospital order, performed in [facility] hospital lab) Nasopharyngeal Nasopharyngeal Swab     Status: None   Collection Time: 09/21/19  3:52 PM   Specimen: Nasopharyngeal Swab  Result Value Ref Range Status   SARS Coronavirus 2 NEGATIVE NEGATIVE Final    Comment:  (NOTE) SARS-CoV-2 target nucleic acids are NOT DETECTED. The SARS-CoV-2 RNA is generally detectable in upper and lower respiratory specimens during the acute phase of infection. The lowest concentration of SARS-CoV-2 viral copies this assay can detect is 250 copies / mL. A negative result does not preclude SARS-CoV-2 infection and should not be used as the sole basis for treatment or other patient management decisions.  A negative result may occur with improper specimen collection / handling, submission of specimen other than nasopharyngeal swab, presence of viral mutation(s) within the areas targeted by this assay, and inadequate number of viral copies (<250 copies / mL). A negative result must be combined with clinical observations, patient history, and epidemiological information. Fact Sheet for Patients:   StrictlyIdeas.no Fact Sheet for Healthcare Providers: BankingDealers.co.za This test is not yet approved or cleared  by the [place] FDA and has been authorized for detection and/or diagnosis of SARS-CoV-2 by FDA under an Emergency Use Authorization (EUA).  This EUA will remain in effect (meaning this test can be used) for the duration of the COVID-19 declaration under Section 564(b)(1) of the Act, 21 U.S.C. section 360bbb-3(b)(1), unless the authorization is terminated or revoked sooner. Performed at [facility], [place], [place] [address]           IMAGING    CT Angio Head W or Wo Contrast  Result Date: 09/21/2019 CLINICAL DATA:  Provided history: Patient EXAM: CT ANGIOGRAPHY HEAD AND NECK TECHNIQUE: Multidetector CT imaging of the head and neck was performed using the standard protocol during bolus administration of intravenous contrast. Multiplanar CT image reconstructions and MIPs were obtained to evaluate the vascular anatomy. Carotid stenosis measurements (when applicable) are obtained  utilizing NASCET criteria, using the distal internal carotid diameter as the denominator. CONTRAST:  58m OMNIPAQUE IOHEXOL 350 MG/ML SOLN COMPARISON:  No pertinent prior studies available for comparison. FINDINGS: CT HEAD FINDINGS Brain: Mild generalized parenchymal atrophy. Mild ill-defined hypoattenuation within the cerebral white matter is nonspecific, but consistent with chronic small vessel ischemic disease. Prominent perivascular space versus less likely chronic lacunar in for within the inferior right basal ganglia. No definite loss of gray-white differentiation is identified. There is no acute intracranial hemorrhage. No extra-axial fluid collection. No evidence of intracranial mass. No midline shift. Vascular: Reported below. Skull: Normal. Negative for fracture or focal lesion. Sinuses: Right maxillary sinus mucous retention cyst. Mild ethmoid sinus mucosal thickening. Trace bilateral mastoid effusions. Orbits: Visualized orbits show no acute finding. Review of the MIP images confirms the above findings CTA NECK FINDINGS Aortic arch: Standard aortic branching. Atherosclerotic plaque within the visualized aortic arch and proximal major branch vessels of the neck. No hemodynamically significant innominate or proximal subclavian artery stenosis. Right carotid system: CCA and ICA patent within the neck. Prominent calcified plaque within the carotid bifurcation and proximal ICA. Quantification of stenosis of the proximal ICA is difficult due to irregularity of calcified plaque but is estimated to be as much as 50-60%. Distal to this, the ICA is  patent within the neck without significant stenosis. Left carotid system: CCA and ICA patent within the neck without significant stenosis (50% or greater). Mild mixed plaque within the carotid bifurcation and proximal ICA. Vertebral arteries: The right vertebral artery is dominant. The vertebral arteries are patent within the neck. Mild to moderate atherosclerotic  narrowing at the origin the right vertebral artery. Skeleton: No acute bony abnormality or aggressive osseous lesion. Cervical spondylosis. Other neck: No neck mass or cervical lymphadenopathy Upper chest: No consolidation within the imaged lung apices. Review of the MIP images confirms the above findings CTA HEAD FINDINGS Anterior circulation: The intracranial internal carotid arteries are patent. Calcified plaque within the cavernous/paraclinoid segments bilaterally with mild stenosis. The M1 middle cerebral arteries are patent without significant stenosis. No M2 proximal branch occlusion is identified. There are sites of moderate/severe stenosis within an inferior division mid M2 left MCA branch (series 18, image 29). The anterior cerebral arteries are patent without significant proximal stenosis. Hypoplastic left A1 segment No intracranial aneurysm is identified. Posterior circulation: Both the right and left V4 vertebral arteries become occluded beyond the origin of the PICA vessels. The basilar artery appears occluded or near occluded proximally and distally with only mild irregular flow seen within the mid basilar artery. The right posterior cerebral artery appears patent, although is diminutive in caliber with irregular flow. Fetal origin left posterior cerebral artery which is patent without high-grade proximal stenosis. The right posterior communicating artery is hypoplastic or absent. Venous sinuses: Within limitations of contrast timing, no convincing thrombus. Anatomic variants: As described. Review of the MIP images confirms the above findings These results were called by telephone at the time of interpretation on 09/21/2019 at 4:59 pm to provider [facility] , who verbally acknowledged these results. IMPRESSION: CT head: 1. No evidence of acute intracranial hemorrhage. No definite demarcated cortical infarct is identified 2. Generalized parenchymal atrophy and chronic small vessel ischemic disease. 3.  Mild paranasal sinus mucosal thickening. 4. Trace bilateral mastoid effusions. CTA neck: 1. The bilateral common and internal carotid arteries are patent within the neck. There is prominent, irregular calcified plaque within the right carotid bifurcation and proximal right ICA. Exact quantification of stenosis of the proximal right ICA is difficult due to irregularity of calcified plaque at this site, but is estimated at up to 50-60% stenosis. Consider carotid artery duplex for further quantification. Mild mixed plaque is present within the left carotid bifurcation and proximal left ICA. 2. The vertebral arteries are patent within the neck bilaterally. Mild to moderate atherosclerotic narrowing at the origin of the right vertebral artery. CTA head: 1. 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 Additionally, the basilar artery appears near occluded or occluded proximally and distally with only mild irregular flow seen within the mid basilar artery. The right posterior cerebral artery appears patent, although this vessel is diminutive with irregular flow. Fetal origin left posterior cerebral artery which is patent without high-grade proximal stenosis. 2. Moderate/severe stenosis within an inferior division mid M2 left MCA branch vessel. Electronically Signed   By: Kellie Simmering DO   On: 09/21/2019 17:21   DG Chest 1 View  Result Date: 10/04/2019 CLINICAL DATA:  Wheezing EXAM: CHEST  1 VIEW COMPARISON:  09/21/2019 FINDINGS: Low volume chest. There is no edema, consolidation, effusion, or pneumothorax. Normal heart size and mediastinal contours. Cholecystectomy clips. IMPRESSION: Stable low volume chest. Electronically Signed   By: Monte Fantasia M.D.   On: 10/13/2019 07:05   CT HEAD WO CONTRAST  Result Date: 09/21/2019 CLINICAL DATA:  Worsening neuro symptoms, recent ischemic event. Evaluate for hemorrhagic conversion. EXAM: CT HEAD WITHOUT CONTRAST TECHNIQUE: Contiguous axial images were obtained  from the base of the skull through the vertex without intravenous contrast. COMPARISON:  MRI 3 hours ago.  Head and neck CTA earlier today. FINDINGS: Brain: Small foci of restricted diffusion involving the pons and right cerebellum are not visualized by CT. There is no hemorrhagic transformation. No acute intraparenchymal, subarachnoid, or subdural hemorrhage. Stable degree of atrophy and chronic small vessel ischemia. No evidence of new ischemia from recent MRI. Vascular: Skull base atherosclerosis. 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 Skull: No fracture or focal lesion. Sinuses/Orbits: No acute findings. Other: None. IMPRESSION: 1. Small foci of restricted diffusion involving the pons and right cerebellum on MRI are not visualized by CT. There is no hemorrhagic transformation. 2. No evidence of new ischemia. 3. Stable atrophy and chronic small vessel ischemia. Electronically Signed   By: Keith Rake M.D.   On: 09/21/2019 22:53   CT Angio Neck W and/or Wo Contrast  Result Date: 09/21/2019 CLINICAL DATA:  Provided history: Patient EXAM: CT ANGIOGRAPHY HEAD AND NECK TECHNIQUE: Multidetector CT imaging of the head and neck was performed using the standard protocol during bolus administration of intravenous contrast. Multiplanar CT image reconstructions and MIPs were obtained to evaluate the vascular anatomy. Carotid stenosis measurements (when applicable) are obtained utilizing NASCET criteria, using the distal internal carotid diameter as the denominator. CONTRAST:  4m OMNIPAQUE IOHEXOL 350 MG/ML SOLN COMPARISON:  No pertinent prior studies available for comparison. FINDINGS: CT HEAD FINDINGS Brain: Mild generalized parenchymal atrophy. Mild ill-defined hypoattenuation within the cerebral white matter is nonspecific, but consistent with chronic small vessel ischemic disease. Prominent perivascular space versus less likely chronic lacunar in for within the inferior right basal ganglia. No definite  loss of gray-white differentiation is identified. There is no acute intracranial hemorrhage. No extra-axial fluid collection. No evidence of intracranial mass. No midline shift. Vascular: Reported below. Skull: Normal. Negative for fracture or focal lesion. Sinuses: Right maxillary sinus mucous retention cyst. Mild ethmoid sinus mucosal thickening. Trace bilateral mastoid effusions. Orbits: Visualized orbits show no acute finding. Review of the MIP images confirms the above findings CTA NECK FINDINGS Aortic arch: Standard aortic branching. Atherosclerotic plaque within the visualized aortic arch and proximal major branch vessels of the neck. No hemodynamically significant innominate or proximal subclavian artery stenosis. Right carotid system: CCA and ICA patent within the neck. Prominent calcified plaque within the carotid bifurcation and proximal ICA. Quantification of stenosis of the proximal ICA is difficult due to irregularity of calcified plaque but is estimated to be as much as 50-60%. Distal to this, the ICA is patent within the neck without significant stenosis. Left carotid system: CCA and ICA patent within the neck without significant stenosis (50% or greater). Mild mixed plaque within the carotid bifurcation and proximal ICA. Vertebral arteries: The right vertebral artery is dominant. The vertebral arteries are patent within the neck. Mild to moderate atherosclerotic narrowing at the origin the right vertebral artery. Skeleton: No acute bony abnormality or aggressive osseous lesion. Cervical spondylosis. Other neck: No neck mass or cervical lymphadenopathy Upper chest: No consolidation within the imaged lung apices. Review of the MIP images confirms the above findings CTA HEAD FINDINGS Anterior circulation: The intracranial internal carotid arteries are patent. Calcified plaque within the cavernous/paraclinoid segments bilaterally with mild stenosis. The M1 middle cerebral arteries are patent without  significant stenosis. No M2 proximal  branch occlusion is identified. There are sites of moderate/severe stenosis within an inferior division mid M2 left MCA branch (series 18, image 29). The anterior cerebral arteries are patent without significant proximal stenosis. Hypoplastic left A1 segment No intracranial aneurysm is identified. Posterior circulation: Both the right and left V4 vertebral arteries become occluded beyond the origin of the PICA vessels. The basilar artery appears occluded or near occluded proximally and distally with only mild irregular flow seen within the mid basilar artery. The right posterior cerebral artery appears patent, although is diminutive in caliber with irregular flow. Fetal origin left posterior cerebral artery which is patent without high-grade proximal stenosis. The right posterior communicating artery is hypoplastic or absent. Venous sinuses: Within limitations of contrast timing, no convincing thrombus. Anatomic variants: As described. Review of the MIP images confirms the above findings These results were called by telephone at the time of interpretation on 09/21/2019 at 4:59 pm to provider [facility] , who verbally acknowledged these results. IMPRESSION: CT head: 1. No evidence of acute intracranial hemorrhage. No definite demarcated cortical infarct is identified 2. Generalized parenchymal atrophy and chronic small vessel ischemic disease. 3. Mild paranasal sinus mucosal thickening. 4. Trace bilateral mastoid effusions. CTA neck: 1. The bilateral common and internal carotid arteries are patent within the neck. There is prominent, irregular calcified plaque within the right carotid bifurcation and proximal right ICA. Exact quantification of stenosis of the proximal right ICA is difficult due to irregularity of calcified plaque at this site, but is estimated at up to 50-60% stenosis. Consider carotid artery duplex for further quantification. Mild mixed plaque is present within  the left carotid bifurcation and proximal left ICA. 2. The vertebral arteries are patent within the neck bilaterally. Mild to moderate atherosclerotic narrowing at the origin of the right vertebral artery. CTA head: 1. 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 Additionally, the basilar artery appears near occluded or occluded proximally and distally with only mild irregular flow seen within the mid basilar artery. The right posterior cerebral artery appears patent, although this vessel is diminutive with irregular flow. Fetal origin left posterior cerebral artery which is patent without high-grade proximal stenosis. 2. Moderate/severe stenosis within an inferior division mid M2 left MCA branch vessel. Electronically Signed   By: Kellie Simmering DO   On: 09/21/2019 17:21   MR BRAIN WO CONTRAST  Result Date: 09/27/2019 CLINICAL DATA:  Neuro deficit with stroke suspected. Worsening grip strength EXAM: MRI HEAD WITHOUT CONTRAST TECHNIQUE: Multiplanar, multiecho pulse sequences of the brain and surrounding structures were obtained without intravenous contrast. COMPARISON:  Brain MRI from yesterday FINDINGS: Brain: Worsening out bilateral pontine acute infarct. Small acute infarct in the right superior cerebellum. No hemorrhage, hydrocephalus, or masslike finding. Mild for age supratentorial chronic small vessel ischemia. Vascular: Absent flow void in the right vertebral and basilar arteries, known from previous study. Skull and upper cervical spine: No focal marrow lesion Sinuses/Orbits: Retention cysts in the right maxillary sinus and partial right mastoid opacification. Please see Z vision dashboard concerning direct communication efforts. These results were called by telephone at the time of interpretation on 09/17/2019 at 8:41 am to provider Dr Reesa Chew, who verbally acknowledged these results. I recommended urgent endovascular consultation. IMPRESSION: Progressive bilateral acute pontine infarction in the  setting of right vertebral and basilar thrombosis. Electronically Signed   By: Monte Fantasia M.D.   On: 09/21/2019 08:42   MR BRAIN WO CONTRAST  Result Date: 09/21/2019 CLINICAL DATA:  Initial evaluation for acute  stroke. EXAM: MRI HEAD WITHOUT CONTRAST TECHNIQUE: Multiplanar, multiecho pulse sequences of the brain and surrounding structures were obtained without intravenous contrast. COMPARISON:  Prior CTA from earlier the same day. FINDINGS: Brain: Generalized age-related cerebral atrophy. Patchy T2/FLAIR hyperintensity seen involving the periventricular deep white matter both cerebral hemispheres most consistent with chronic small vessel ischemic disease. Few scatter remote lacunar infarcts noted about the basal ganglia bilaterally. Few scattered foci of corresponding chronic blood products noted. Patchy diffusion abnormality seen involving the central and right paramedian pons, consistent with acute ischemic infarct (series 5, image 73). Additional punctate involvement of the right cerebellum (series 5, image 74). No associated hemorrhage or mass effect. No other evidence for acute or subacute ischemia. Gray-white matter differentiation otherwise maintained. No other evidence for acute or chronic intracranial hemorrhage. No mass lesion, midline shift or mass effect. No hydrocephalus or extra-axial fluid collection. Empty sella noted. Midline structures intact. Vascular: Loss of normal flow void within the basilar artery, consistent with previously identified severe vascular disease at this location as seen on prior CTA (series 10, image 5). Atherosclerotic change noted within the distal vertebral arteries as well. Major intracranial vascular flow voids otherwise maintained. Skull and upper cervical spine: Craniocervical junction within normal limits. Upper cervical spine normal. Bone marrow signal intensity within normal limits. No scalp soft tissue abnormality. Sinuses/Orbits: Globes and orbital soft  tissues within normal limits. Scattered mucosal thickening in opacity noted within the ethmoidal air cells. Small right maxillary sinus retention cyst. Right mastoid effusion noted, with additional trace left mastoid effusion. Findings of doubtful significance. Visualized nasopharynx within normal limits. Other: None. IMPRESSION: 1. Patchy small volume acute ischemic nonhemorrhagic infarcts involving the central and right paramedian pons as well as the right cerebellum. No associated mass effect. 2. Loss of normal flow void within the basilar artery, consistent with previously identified severe vascular disease at this location as seen on prior CTA. 3. Underlying age-related cerebral atrophy with chronic small vessel ischemic disease. Electronically Signed   By: Jeannine Boga M.D.   On: 09/21/2019 20:15   DG Chest Portable 1 View  Result Date: 09/21/2019 CLINICAL DATA:  Left-sided weakness, CVA symptoms EXAM: PORTABLE CHEST 1 VIEW COMPARISON:  07/14/2005 FINDINGS: The heart size and mediastinal contours are within normal limits. Both lungs are clear. The visualized skeletal structures are unremarkable. IMPRESSION: No active disease. Electronically Signed   By: Randa Ngo M.D.   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 TRANSFER TO [facility] ICU  Acute toxic metabolic encephalopathy   CARDIAC ICU monitoring   GI GI PROPHYLAXIS as indicated  NUTRITIONAL STATUS DIET-->NPO Constipation protocol as indicated   ENDO - will use ICU hypoglycemic\Hyperglycemia protocol if needed  ELECTROLYTES -follow labs as needed -replace as needed -pharmacy consultation and following    DVT/GI PRX ordered and assessed TRANSFUSIONS AS NEEDED MONITOR FSBS I Assessed the need for Labs I Assessed the need for Foley I Assessed the need for Central Venous Line Family Discussion when available I Assessed the need for Mobilization I made an Assessment of medications to be adjusted accordingly Safety Risk assessment Completed  CASE DISCUSSED IN MULTIDISCIPLINARY ROUNDS WITH ICU TEAM   Critical Care Time devoted to patient care services described in this note is 45 minutes.   Overall, patient is critically ill, prognosis is guarded.  Patient with Multiorgan failure and at high risk for cardiac arrest and death.    Corrin Parker, M.D.  [facility]  Medical Director [place] Director [facility] Cardio-Pulmonary Department

## 2019-09-22 NOTE — Progress Notes (Signed)
SLP Cancellation Note  Patient Details Name: Jorge Campbell. MRN: [id number] DOB: 1935/10/24   Cancelled treatment:       Reason Eval/Treat Not Completed: Medical issues which prohibited therapy   Pt with medical decline, transferred to ICU, now intubated with transfer to [facility] planned. ST to sign off at this time.   Dalayna Lauter B. Rutherford Nail M.S., CCC-SLP, [place] Pathologist Rehabilitation Services Office [phone]    Jodiann Ognibene Rutherford Nail 09/26/2019, 10:21 AM

## 2019-09-22 NOTE — Progress Notes (Signed)
SLP Cancellation Note  Patient Details Name: Jorge Adrian Wigle Sr. MRN: [id number] DOB: 09/15/1935   Cancelled treatment:       Reason Eval/Treat Not Completed: Patient not medically ready (intubated, sedated). Will f/u as able.    Osie Bond., M.A. [place] [facility] Pager [phone] Office [phone]  10/01/2019, 4:16 PM

## 2019-09-22 NOTE — Progress Notes (Cosign Needed)
FOR EDUCATIONAL USE ONLY - NOT OFFICIAL PORTION OF MEDICAL RECORD  NAME:  Jorge Treptow., MRN:  [id number], DOB:  Nov 06, 1935, LOS: 0 ADMISSION DATE:  10/02/2019, CONSULTATION DATE:  09/26/2019 REFERRING MD:  Leonel Ramsay, CHIEF COMPLAINT:  AMS   Brief History   85 year old male with PMH significant for DM, mild dementia, and HTN who presented to [facility] on 09/21/19 with dysarthria and left-sided weakness. LKWT 14 hours prior. 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 He continued to decline overnight and required intubation for airway protection early on 09/21/2019.   History of present illness   Jorge Campbell is an 84 year old male with PMH significant for DM, mild dementia, and HTN who presented to [facility] on 09/21/19 after being found with dysarthria and left-sided weakness by son. LKWT was 14 hours PTA. 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   Neurology was consulted at [facility] and it was felt that there was no LVO for intervention and he was outside of the window for tPA. Therefore, he was admitted to a telemetry floor and started on DAPT. Overnight, his neurological deficits worsened with flaccidity of the RUE and non-verbalization. He was transferred to the ICU where he continued to worsen and he was intubated for airway protection. MRI was obtained which showed acute pontine infarcts in the setting of right vertebral and basilar thrombosis.  He was transferred to the Neuro ICU at [facility] for further management.   Past Medical History  DM II HTN CVA BPH Dementia Allergic rhinitis  Significant Hospital Events   6/6 Presented to [facility] 6/7 Transferred to [facility]. Intubated for airway protection.  Consults:  PCCM  Procedures:  ETT 6/7 >>   Significant Diagnostic Tests:  6/7 CXR >> ETT projects 1.6cm above carina. Small left pleural effusion and atelectasis.    6/7 MRI Head >> Worsening of acute bilateral pontine infarcts. Small acute right cerebellar infarct. No hemorrhage, hydrocephalus, or masses. Absent flow in right vertebral and basilar arteries.   6/6 CTA Head/Neck >> Distal V4 vertebral arteries occluded bilaterally. Basilar artery occluded. Irregular flow in right PCA. Moderate stenosis of left MCA M2 division.  Micro Data:  6/6 SARS CoV2 >> Negative 6/7 MRSA PCR >> Negative 6/7 Urine Cx >> NGTD  Antimicrobials:    Interim history/subjective:  Admitted to telemetry floor overnight. He had gradual worsening of his neuro deficits and was found unable to protect his airway this morning. He was intubated and transferred to Neuro ICU at [facility].   Objective   Blood pressure (!) 158/81, temperature 97.9 F (36.6 C), temperature source Axillary, resp. rate 15, SpO2 97 %.    Vent Mode: PRVC FiO2 (%):  [50 %-100 %] 50 % Set Rate:  [16 bmp] 16 bmp Vt Set:  [500 mL] 500 mL PEEP:  [5 cmH20] 5 cmH20 Plateau Pressure:  [14 cmH20-18 cmH20] 18 cmH20  No intake or output data in the 24 hours ending 10/02/2019 1318 There were no vitals filed for this visit.  Examination: General: Elderly male laying in bed with eyes closed on MV HENT: Non-icteric sclera. MMM. Endotracheal tube present.  Lungs: CTAB. No accessory muscle use. Synchronous with ventilator. Cardiovascular: S1/S2. NSR. No murmurs, rubs, gallops Abdomen: Soft, non-tender, not distended Extremities: No peripheral edema. Normal muscle tone and bulk Neuro: Opens eyes to voice. Appears to follow commands by tracking eyes. Triple flexion  with light stimulation of bilateral lower extremities. No upper extremity movement noted. GU: Foley catheter, some hematuria in bag. Scant blood from penile meatus.   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 Outside of window for tPA.   Plan: -Per Neurology -Recommend getting Neuro IR on board for possible intervention  -May consider holding ASA/Plavix if Neuro IR able to intervene -Keep euglycemic / normothermic -Neuro check q1h -Echo  Acute respiratory failure with inability to protect airway secondary to acute pontine infarct ABG drawn which shows pH 7.37, pCO2 38, pO2 392. PRVC 16/500/50/5  Plan: -Continue current vent settings and wean as tolerated -Peak pressures <30 cmH2O -VAP prevention bundle -SAT/SBT as mentation allows. -SpO2 > 92% -AM CXR  Hyperlipidemia LDL 118. HDL 32. Atorvastatin 80mg  daily  Plan: -Goal LDL <70 in setting of clinical ASCVD -Consider restarting atorvastatin -Consider adding ezetimibe to regimen when approaching discharge  Hypertension Takes metoprolol 25mg  BID at home.   Plan: -Keep SBP 140-180 -Add PRN anti-HTN   DM II, controlled A1c 6.7. Takes metformin 500mg  BID at home.   Plan: -Add SSI -Keep BG < 180mg /dL  Hx dementia, mild Takes memantine at home.  Plan: -Promote minimal stimulation and adequate sleep hygiene -Would hold off on restarting memantine at this time  Best practice:  Diet: NPO Pain/Anxiety/Delirium protocol (if indicated): Fentanyl drip VAP protocol (if indicated): Yes DVT prophylaxis: Enoxaparin GI prophylaxis: PPI Glucose control: SSI Mobility: BR Code Status: Full Family Communication: Pending Disposition: Neuro ICU  Labs   CBC: Recent Labs  Lab 09/21/19 1528  WBC 10.5  NEUTROABS 8.3*  HGB 11.9*  HCT 37.0*  MCV 86.2  PLT 376    Basic Metabolic Panel: Recent Labs  Lab 09/21/19 1528 09/19/2019 0525  NA 138 141  K 4.0 3.6  CL 107 111  CO2 23 21*  GLUCOSE 164* 145*  BUN 19 16  CREATININE 1.36* 1.21  CALCIUM 8.5* 8.2*   GFR: Estimated Creatinine Clearance: 51.7 mL/min (by C-G formula based on SCr of 1.21 mg/dL). Recent Labs  Lab 09/21/19 1528  WBC 10.5    Liver  Function Tests: Recent Labs  Lab 09/21/19 1528  AST 15  ALT 11  ALKPHOS 56  BILITOT 0.8  PROT 6.9  ALBUMIN 3.7   No results for input(s): LIPASE, AMYLASE in the last 168 hours. No results for input(s): AMMONIA in the last 168 hours.  ABG    Component Value Date/Time   PHART 7.37 10/02/2019 1047   PCO2ART 38 09/16/2019 1047   PO2ART 392 (H) 10/13/2019 1047   HCO3 22.0 09/19/2019 1047   O2SAT 100.0 10/04/2019 1047     Coagulation Profile: Recent Labs  Lab 09/21/19 1528  INR 1.1    Cardiac Enzymes: No results for input(s): CKTOTAL, CKMB, CKMBINDEX, TROPONINI in the last 168 hours.  HbA1C: Hgb A1c MFr Bld  Date/Time Value Ref Range Status  10/12/2019 05:25 AM 6.7 (H) 4.8 - 5.6 % Final    Comment:    (NOTE) Pre diabetes:          5.7%-6.4% Diabetes:              >6.4% Glycemic control for   <7.0% adults with diabetes     CBG: Recent Labs  Lab 09/21/19 2153 09/18/2019 0128 10/11/2019 0544 10/02/2019 0815  GLUCAP 134* 123* 137* 152*    Review  of Systems:   Unable to obtain secondary to intubation & sedation  Past Medical History  He,  has a past medical history of Diabetes mellitus without complication ([place]) and Hypertension.   Surgical History    Past Surgical History:  Procedure Laterality Date  . CHOLECYSTECTOMY       Social History   reports that he has been smoking. He has never used smokeless tobacco. He reports that he does not drink alcohol or use drugs.   Family History   His family history is not on file.   Allergies No Known Allergies   Home Medications  Prior to Admission medications   Medication Sig Start Date End Date Taking? Authorizing Provider  aspirin EC 81 MG tablet Take 81 mg by mouth daily.    [provider]  atorvastatin (LIPITOR) 80 MG tablet Take 1 tablet (80 mg total) by mouth daily. 10/09/2019   Lorella Nimrod, MD  Cholecalciferol (VITAMIN D3) 125 MCG (5000 UT) CAPS Take 1 capsule by mouth once a week.  01/13/19    [provider]  clopidogrel (PLAVIX) 75 MG tablet Take 1 tablet (75 mg total) by mouth once for 1 dose. 10/12/2019 09/16/2019  Lorella Nimrod, MD  finasteride (PROSCAR) 5 MG tablet Take 1 tablet (5 mg total) by mouth daily. 04/01/19   Hollice Espy, MD  loratadine (CLARITIN) 10 MG tablet Take 10 mg by mouth daily as needed.  10/12/18   [provider]  memantine (NAMENDA) 5 MG tablet Take 5 mg by mouth daily.  01/26/19   [provider]  metFORMIN (GLUCOPHAGE) 500 MG tablet Take 500 mg by mouth 2 (two) times daily with a meal.  11/19/18   [provider]  metoprolol tartrate (LOPRESSOR) 25 MG tablet Take 25 mg by mouth daily at 6 (six) AM.  12/28/18   [provider]  Skin Protectants, Misc. (EUCERIN) cream Apply topically as needed for dry skin.    [provider]  tadalafil (CIALIS) 5 MG tablet Take 1 tablet (5 mg total) by mouth daily as needed for erectile dysfunction. 07/01/19   Zara Council A, PA-C  tamsulosin (FLOMAX) 0.4 MG CAPS capsule Take 1 capsule (0.4 mg total) by mouth daily. 04/01/19   Hollice Espy, MD     Critical care time: 60 minutes    Electronically signed by: Adonis Brook, [place], 09/27/2019 @ 1425

## 2019-09-22 NOTE — Significant Event (Signed)
Rapid Response Event Note  Overview:      Initial Focused Assessment: Patient found having respiratory distress though oxygen saturations in the 90's on 4 liters nasal cannula. Patient seems alert but is not following commands- left side facial droop- Vitals stable- bp 521'V systolic.    Interventions: Dr. Reesa Chew and Dr. Doy Mince at bedside.  Dr. Reesa Chew stated that patient was accepted to Neuro ICU at  [facility] and has a bed.  Waiting for carelink for transport.  Patient then was transferred to our ICU for intubation for airway protection prior to transferring to [facility]. Plan of Care (if not transferred):  Event Summary:   at      at          [facility]

## 2019-09-22 NOTE — Progress Notes (Signed)
°   09/28/2019 0606  Assess: MEWS Score  Pulse Rate (!) 108  ECG Heart Rate (!) 109  Resp (!) 25 (NP aware)  SpO2 93 %  Assess: MEWS Score  MEWS Temp 0  MEWS Systolic 0  MEWS Pulse 1  MEWS RR 1  MEWS LOC 0  MEWS Score 2  MEWS Score Color Yellow  Assess: if the MEWS score is Yellow or Red  Were vital signs taken at a resting state? Yes  Focused Assessment Documented focused assessment  Early Detection of Sepsis Score *See Row Information* Low  MEWS guidelines implemented *See Row Information* Yes  Treat  MEWS Interventions Other (Comment) (NP aware and is coming to assess pt)  Take Vital Signs  Increase Vital Sign Frequency  Yellow: Q 2hr X 2 then Q 4hr X 2, if remains yellow, continue Q 4hrs  Escalate  MEWS: Escalate Yellow: discuss with charge nurse/RN and consider discussing with provider and RRT  Notify: Charge Nurse/RN  Name of Charge Nurse/RN Notified Wiliam Ke RN  Date Charge Nurse/RN Notified 10/04/2019  Time Charge Nurse/RN Notified 0606  Notify: Provider  Provider Name/Title Sharion Settler NP  Date Provider Notified 10/08/2019  Time Provider Notified [phone]  Notification Type Page  Notification Reason Change in status  Response Other (Comment) (states that she is coming to assess pt)  Date of Provider Response 09/21/2019  Time of Provider Response [phone]

## 2019-09-22 NOTE — Consult Note (Addendum)
NAME:  Jorge Campbell., MRN:  [id number], DOB:  1936-01-02, LOS: 0 ADMISSION DATE:  10/03/2019, CONSULTATION DATE:  09/20/2019 REFERRING MD:  Leonel Ramsay - neuro, CHIEF COMPLAINT:  L sided weakness, respiratory failure  Brief History   84 yo M with progressive acute pontine infarcts due to vertebral artery and basilar artery occlusions, admitted to [facility] 6/6. Intubated, transferred from [name] to [facility] 6/7   History of present illness   History obtained from chart review  84 yo M PMH HTN Dm2 BPH mild dementia who presented to [name] ED with CC L sided weakness after being identified by family. LKN 14 hrs prior to presentation. Additional sx include slurred speech, but the onset of this sx is unclear to family. Underwent stroke workup at [name] and found to have bilateral occluded vertebral and basilar arteries, admitted.  6/6 overnight, pt noted to be aphasic, found to have increased r sided weakness. 6/7 AM noted to have snoring respirations.  MRI Brain obtained 6/7, showing progressivepontine infarcts. Pt intubated for airway protection, transferred to ICU. Then transferred to [facility] ICU for further care.   Past Medical History  HTN DM2 BPH Mild dementia  Significant Hospital Events   6/6 admitted to [name] for vertebral artery and basilar artery occlusion 6/7 Intubated, progressive pontine infarcts, transferred to [name] ICU   Consults:  PCCM   Procedures:  ETT 6/7>>   Significant Diagnostic Tests:  CTA head neck> Dominant R vertebral artery, mild-moderate narrowing of R vertebral artery. Basilar artery appears occluded. R posterior cerebral artery appears patent. Moderate-severe stenosis within L MCA branch vessel  MRI brain  6/6> Small volume acute pontine infarcts, without mass effect  MRI Brain 6/7> progressive bilateral acute pontine infarcts  Micro Data:  09/21/19 SARS Cov2> neg   Antimicrobials:    Interim history/subjective:  Transferred to [facility] ICU   Objective   Blood pressure (!)  158/81, temperature 97.9 F (36.6 C), temperature source Axillary, resp. rate 15, SpO2 97 %.    Vent Mode: PRVC FiO2 (%):  [50 %-100 %] 50 % Set Rate:  [16 bmp] 16 bmp Vt Set:  [500 mL] 500 mL PEEP:  [5 cmH20] 5 cmH20 Plateau Pressure:  [14 cmH20-18 cmH20] 18 cmH20  No intake or output data in the 24 hours ending 10/07/2019 1400 There were no vitals filed for this visit.  Examination: General: Critically ill appearing older adult M, intubated, sedated.  HENT: NCAT  Pink mmm ETT OGT secure Trachea midline Anicteric sclere Lungs: CTA bilaterally. Symmetrical chest expansion, mechanically ventilated  Cardiovascular: RRR s1s2 no rgm. Cap refill < 3 seconds. 2+ radial pulses  Abdomen: Soft round ndnt + bowel sounds Extremities: Symmetrical muscle bulk BUE BLE. No obvious joint deformity. No cyanosis or clubbing  Neuro: Moves eyes up, down, to command. Questionable L and R eye movement to command. BLE triple flexion.  GU: Foley with blood tinged urine   Resolved Hospital Problem list     Assessment & Plan:   Acute respiratory failure with hypoxia due to inability to protect airway, insetting of vertebral artery and basilar artery occlusion  P -Continue MV -AM CXR -VAP, PAD bundles -Adjust PEEP/FiO2 for SpO2 > 92%   Progressive bilateral acute pontine infarction in setting of Vertebral artery and basilar artery thrombus occlusion -initially presented to [name], outside of tpa window, transferred to [name] 09/16/2019 P -per neurology  -ASA, plavix -ECHO    Hx mild dementia -may slightly confound neuro exams  DM2 P -SSI  HTN  P -ICU monitoring  BPH Blood tinged urine -suspect trauma due to foley placement P -cont foley -follow up UA  Inadequate PO intake P -EN per RDN   Best practice:  Diet:  EN per RDN Pain/Anxiety/Delirium protocol (if indicated): precedex, fentanyl  VAP protocol (if indicated): yes DVT prophylaxis: SCD GI prophylaxis: protonix  Glucose control:  Monitor  Mobility: BR-- rec passive ROM in bed  Code Status: Full Family Communication: per primary  Disposition: ICU   Labs   CBC: Recent Labs  Lab 09/21/19 1528  WBC 10.5  NEUTROABS 8.3*  HGB 11.9*  HCT 37.0*  MCV 86.2  PLT 824    Basic Metabolic Panel: Recent Labs  Lab 09/21/19 1528 10/13/2019 0525  NA 138 141  K 4.0 3.6  CL 107 111  CO2 23 21*  GLUCOSE 164* 145*  BUN 19 16  CREATININE 1.36* 1.21  CALCIUM 8.5* 8.2*   GFR: Estimated Creatinine Clearance: 51.7 mL/min (by C-G formula based on SCr of 1.21 mg/dL). Recent Labs  Lab 09/21/19 1528  WBC 10.5    Liver Function Tests: Recent Labs  Lab 09/21/19 1528  AST 15  ALT 11  ALKPHOS 56  BILITOT 0.8  PROT 6.9  ALBUMIN 3.7   No results for input(s): LIPASE, AMYLASE in the last 168 hours. No results for input(s): AMMONIA in the last 168 hours.  ABG    Component Value Date/Time   PHART 7.37 10/02/2019 1047   PCO2ART 38 09/28/2019 1047   PO2ART 392 (H) 10/05/2019 1047   HCO3 22.0 09/27/2019 1047   O2SAT 100.0 10/15/2019 1047     Coagulation Profile: Recent Labs  Lab 09/21/19 1528  INR 1.1    Cardiac Enzymes: No results for input(s): CKTOTAL, CKMB, CKMBINDEX, TROPONINI in the last 168 hours.  HbA1C: Hgb A1c MFr Bld  Date/Time Value Ref Range Status  09/21/2019 05:25 AM 6.7 (H) 4.8 - 5.6 % Final    Comment:    (NOTE) Pre diabetes:          5.7%-6.4% Diabetes:              >6.4% Glycemic control for   <7.0% adults with diabetes     CBG: Recent Labs  Lab 09/21/19 2153 09/21/2019 0128 09/27/2019 0544 10/07/2019 0815  GLUCAP 134* 123* 137* 152*    Review of Systems:   Unable to obtain, intubated and sedated   Past Medical History  He,  has a past medical history of Diabetes mellitus without complication ([place]) and Hypertension.   Surgical History    Past Surgical History:  Procedure Laterality Date  . CHOLECYSTECTOMY       Social History   reports that he has been smoking. He  has never used smokeless tobacco. He reports that he does not drink alcohol or use drugs.   Family History   His family history is not on file.   Allergies No Known Allergies   Home Medications  Prior to Admission medications   Medication Sig Start Date End Date Taking? Authorizing Provider  aspirin EC 81 MG tablet Take 81 mg by mouth daily.    [provider]  atorvastatin (LIPITOR) 80 MG tablet Take 1 tablet (80 mg total) by mouth daily. 10/14/2019   Lorella Nimrod, MD  Cholecalciferol (VITAMIN D3) 125 MCG (5000 UT) CAPS Take 1 capsule by mouth once a week.  01/13/19   [provider]  clopidogrel (PLAVIX) 75 MG tablet Take 1 tablet (75 mg total) by mouth once  for 1 dose. 10/13/2019 09/20/2019  Lorella Nimrod, MD  finasteride (PROSCAR) 5 MG tablet Take 1 tablet (5 mg total) by mouth daily. 04/01/19   Hollice Espy, MD  loratadine (CLARITIN) 10 MG tablet Take 10 mg by mouth daily as needed.  10/12/18   [provider]  memantine (NAMENDA) 5 MG tablet Take 5 mg by mouth daily.  01/26/19   [provider]  metFORMIN (GLUCOPHAGE) 500 MG tablet Take 500 mg by mouth 2 (two) times daily with a meal.  11/19/18   [provider]  metoprolol tartrate (LOPRESSOR) 25 MG tablet Take 25 mg by mouth daily at 6 (six) AM.  12/28/18   [provider]  Skin Protectants, Misc. (EUCERIN) cream Apply topically as needed for dry skin.    [provider]  tadalafil (CIALIS) 5 MG tablet Take 1 tablet (5 mg total) by mouth daily as needed for erectile dysfunction. 07/01/19   Zara Council A, PA-C  tamsulosin (FLOMAX) 0.4 MG CAPS capsule Take 1 capsule (0.4 mg total) by mouth daily. 04/01/19   Hollice Espy, MD     Critical care time: 45 min       CRITICAL CARE Performed by: Cristal Generous   Total critical care time: 45 minutes  Critical care time was exclusive of separately billable procedures and treating other patients. Critical care was necessary to  treat or prevent imminent or life-threatening deterioration.  Critical care was time spent personally by me on the following activities: development of treatment plan with patient and/or surrogate as well as nursing, discussions with consultants, evaluation of patient's response to treatment, examination of patient, obtaining history from patient or surrogate, ordering and performing treatments and interventions, ordering and review of laboratory studies, ordering and review of radiographic studies, pulse oximetry and re-evaluation of patient's condition.  Eliseo Gum MSN, AGACNP-BC [place] [phone] If no answer, [phone] 09/20/2019, 2:00 PM

## 2019-09-22 NOTE — Progress Notes (Signed)
PT Cancellation Note  Patient Details Name: Jorge Reader Arndt Sr. MRN: [id number] DOB: [birth year]   Cancelled Treatment:    Reason Eval/Treat Not Completed: Medical issues which prohibited therapy(Consult received and chart reviewed.  Patient noted with decline in medical status; now transferred to CCU, intubated and pending transfer to outside facility.  Will complete order at this time; please re-consult as medically appropriate)   Carla Rashad H. Owens Shark, PT, DPT, NCS 09/29/2019, 10:23 AM [phone]

## 2019-09-23 ENCOUNTER — Inpatient Hospital Stay (HOSPITAL_COMMUNITY): Payer: Medicare Other

## 2019-09-23 DIAGNOSIS — Z978 Presence of other specified devices: Secondary | ICD-10-CM

## 2019-09-23 LAB — URINE CULTURE: Culture: NO GROWTH

## 2019-09-23 LAB — GLUCOSE, CAPILLARY
Glucose-Capillary: 112 mg/dL — ABNORMAL HIGH (ref 70–99)
Glucose-Capillary: 119 mg/dL — ABNORMAL HIGH (ref 70–99)
Glucose-Capillary: 141 mg/dL — ABNORMAL HIGH (ref 70–99)
Glucose-Capillary: 142 mg/dL — ABNORMAL HIGH (ref 70–99)
Glucose-Capillary: 146 mg/dL — ABNORMAL HIGH (ref 70–99)
Glucose-Capillary: 162 mg/dL — ABNORMAL HIGH (ref 70–99)

## 2019-09-23 LAB — BASIC METABOLIC PANEL
Anion gap: 13 (ref 5–15)
BUN: 20 mg/dL (ref 8–23)
CO2: 19 mmol/L — ABNORMAL LOW (ref 22–32)
Calcium: 7.8 mg/dL — ABNORMAL LOW (ref 8.9–10.3)
Chloride: 110 mmol/L (ref 98–111)
Creatinine, Ser: 1.17 mg/dL (ref 0.61–1.24)
GFR calc Af Amer: >60 mL/min (ref >60)
GFR calc non Af Amer: 58 mL/min — ABNORMAL LOW (ref >60)
Glucose, Bld: 146 mg/dL — ABNORMAL HIGH (ref 70–99)
Potassium: 4.5 mmol/L (ref 3.5–5.1)
Sodium: 142 mmol/L (ref 135–145)

## 2019-09-23 MED ORDER — VITAL 1.5 CAL PO LIQD
1000.0000 mL | ORAL | Status: DC
  Administered 2019-09-23: 1000 mL
  Filled 2019-09-23 (×2): qty 1000

## 2019-09-23 MED ORDER — ADULT MULTIVITAMIN W/MINERALS CH
1.0000 | ORAL_TABLET | Freq: Every day | ORAL | Status: DC
  Administered 2019-09-23 – 2019-09-24 (×2): 1
  Filled 2019-09-23 (×2): qty 1

## 2019-09-23 MED ORDER — ASPIRIN 300 MG RE SUPP
300.0000 mg | Freq: Every day | RECTAL | Status: DC
  Administered 2019-09-23 – 2019-09-24 (×2): 300 mg via RECTAL
  Filled 2019-09-23 (×2): qty 1

## 2019-09-23 MED ORDER — CHLORHEXIDINE GLUCONATE CLOTH 2 % EX PADS: 6.0000 | MEDICATED_PAD | Freq: Every day | CUTANEOUS | Status: DC

## 2019-09-23 MED ORDER — PRO-STAT SUGAR FREE PO LIQD
30.0000 mL | Freq: Three times a day (TID) | ORAL | Status: DC
  Administered 2019-09-23 – 2019-09-24 (×3): 30 mL
  Filled 2019-09-23 (×3): qty 30

## 2019-09-23 MED ORDER — CHLORHEXIDINE GLUCONATE CLOTH 2 % EX PADS
6.0000 | MEDICATED_PAD | Freq: Every day | CUTANEOUS | Status: DC
  Administered 2019-09-23 – 2019-09-24 (×2): 6 via TOPICAL

## 2019-09-23 NOTE — Progress Notes (Signed)
PT Cancellation Note  Patient Details Name: Jorge Kossman Stefanelli Sr. MRN: [id number] DOB: 01-02-1936   Cancelled Treatment:    Reason Eval/Treat Not Completed: Patient not medically ready  Noted patient remains very sedated per RASS score (-4). Will monitor and proceed with PT evaluation as appropriate.   Arby Barrette, PT Pager [phone]  Rexanne Mano 09/23/2019, 8:07 AM

## 2019-09-23 NOTE — Progress Notes (Addendum)
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 Intubated, transferred from [facility] to [facility] 6/7   History of present illness   History obtained from chart review  84 yo M PMH HTN Dm2 BPH mild dementia who presented to [facility] ED with CC L sided weakness after being identified by family. LKN 14 hrs prior to presentation. Additional sx include slurred speech, but the onset of this sx is unclear to family. Underwent stroke workup at [facility] and found to have bilateral occluded vertebral and basilar arteries, admitted.  6/6 overnight, pt noted to be aphasic, found to have increased r sided weakness. 6/7 AM noted to have snoring respirations.  MRI Brain obtained 6/7, showing progressivepontine infarcts. Pt intubated for airway protection, transferred to ICU. Then transferred to [facility] ICU for further care.   Past Medical History  HTN DM2 BPH Mild dementia  Significant Hospital Events   6/6 admitted to [facility] for vertebral artery and basilar artery occlusion 6/7 Intubated, progressive pontine infarcts, transferred to [facility] ICU   Consults:  PCCM   Procedures:  ETT 6/7>>   Significant Diagnostic Tests:  CTA head neck> Dominant R vertebral artery, mild-moderate narrowing of R vertebral artery. Basilar artery appears occluded. R posterior cerebral artery appears patent. Moderate-severe stenosis within L MCA branch vessel  MRI brain  6/6> Small volume acute pontine infarcts, without mass effect  MRI Brain 6/7> progressive bilateral acute pontine infarcts Echo 6/7:  LVEF 55-60%, mild LVH with grade I diastolic dysfunction, aneurysm of ascending aorta to 86mm  Micro Data:  09/21/19 SARS Cov2> neg  6/7  urine: multiple species-> negative Antimicrobials:    Interim history/subjective:  6/8: tmax 100.5, fio2 40%, off sedation since 0500 and unresponsive at this time.  6/7:Transferred to [facility] ICU   Objective   Blood pressure (!) 111/46, pulse 71, temperature 99.3 F (37.4 C), temperature source Axillary, resp. rate 16, SpO2 99 %.    Vent Mode: PRVC FiO2 (%):  [40 %-100 %] 40 % Set Rate:  [16 bmp] 16 bmp Vt Set:  [500 mL] 500 mL PEEP:  [5 cmH20] 5 cmH20 Plateau Pressure:  [14 cmH20-18 cmH20] 15 cmH20   Intake/Output Summary (Last 24 hours) at 09/23/2019 0744 Last data filed at 09/23/2019 0700 Gross per 24 hour  Intake 1333.31 ml  Output 650 ml  Net 683.31 ml   There were no vitals filed for this visit.  Examination: General: Critically ill appearing older adult M, intubated, unresponsive HENT: NCAT , pupils pinpoint bilaterally.  ETT OGT Trachea midline Anicteric sclera Lungs: CTA bilaterally. Symmetrical chest expansion, mechanically ventilated  Cardiovascular: RRR s1s2 no rgm. Cap refill < 3 seconds. 2+ radial pulses  Abdomen: Soft  ndnt + bowel sounds Extremities: Symmetrical muscle bulk BUE BLE. No obvious joint deformity. No cyanosis or clubbing  Neuro: + babinski otherwise unresponsive GU: Foley with blood tinged urine   Resolved Hospital Problem list     Assessment & Plan:   Acute respiratory failure with hypoxia due to inability to protect airway, insetting of vertebral artery and basilar artery occlusion  P -Continue MV -VAP, PAD bundles -Adjust PEEP/FiO2 for SpO2 > 92%  -on minimal settings but mental status precluding extubation.  Progressive bilateral acute pontine infarction in setting of Vertebral artery and basilar artery thrombus occlusion -initially presented to [facility], outside of tpa window, transferred to [facility] 09/27/2019 P -per neurology  -ASA, plavix -ECHO without shunt and LVEF 55-60% with grade I diastolic dysfunction   Hx mild dementia -may slightly  confound neuro exams  DM2 P -SSI -a1c 6.7  HTN P -ICU monitoring  Normocytic anemia:  -stable  BPH Blood tinged urine -suspect trauma due to foley placement P -cont foley -follow up UA  Inadequate PO intake P -EN per RDN   Best practice:  Diet:  EN per RDN Pain/Anxiety/Delirium protocol (if indicated): hold VAP protocol (if indicated): yes DVT prophylaxis: SCD GI prophylaxis: protonix  Glucose control: Monitor  Mobility: BR-- rec passive ROM in bed  Code Status: Full Family Communication: per primary  Disposition: ICU   Labs   CBC: Recent Labs  Lab 09/21/19 1528 10/06/2019 1601  WBC 10.5 11.5*  NEUTROABS 8.3*  --   HGB 11.9* 11.2*  HCT 37.0* 36.5*  MCV 86.2 89.2  PLT 300 665    Basic Metabolic Panel: Recent Labs  Lab 09/21/19 1528 10/12/2019 0525 09/23/19 0604  NA 138 141 142  K 4.0 3.6 4.5  CL 107 111 110  CO2 23 21* 19*  GLUCOSE 164* 145* 146*  BUN 19 16 20   CREATININE 1.36* 1.21 1.17  CALCIUM 8.5* 8.2* 7.8*   GFR: Estimated Creatinine Clearance: 53.4 mL/min (by C-G formula based on SCr of 1.17 mg/dL). Recent Labs  Lab 09/21/19 1528 09/16/2019 1601  WBC 10.5 11.5*    Liver Function Tests: Recent Labs  Lab 09/21/19 1528  AST 15  ALT 11  ALKPHOS 56  BILITOT 0.8  PROT 6.9  ALBUMIN 3.7   No results for input(s): LIPASE, AMYLASE in the last 168 hours. No results for input(s): AMMONIA in the last 168 hours.  ABG    Component Value Date/Time   PHART 7.37 10/01/2019 1047   PCO2ART 38 09/23/2019 1047   PO2ART 392 (H) 10/07/2019 1047   HCO3 22.0 09/18/2019 1047   O2SAT 100.0 10/13/2019 1047     Coagulation Profile: Recent Labs  Lab 09/21/19 1528  INR 1.1    Cardiac Enzymes: No results for input(s): CKTOTAL, CKMB, CKMBINDEX, TROPONINI in the last 168 hours.  HbA1C: Hgb A1c MFr Bld  Date/Time Value Ref Range Status  10/13/2019 05:25 AM 6.7 (H) 4.8 - 5.6 % Final    Comment:    (NOTE) Pre diabetes:           5.7%-6.4% Diabetes:              >6.4% Glycemic control for   <7.0% adults with diabetes     CBG: Recent Labs  Lab 10/15/2019 0815 09/23/2019 1632 10/12/2019 1946 09/27/2019 2323 09/23/19 0324  GLUCAP 152* 144* 123* 121* 146*       Critical care time: The patient is critically ill with multiple organ systems failure and requires high complexity decision making for assessment and support, frequent evaluation and titration of therapies, application of advanced monitoring technologies and extensive interpretation of multiple databases.  Critical care time 35 mins. This represents my time independent of the NPs time taking care of the pt. This is excluding procedures.    [facility] 09/23/2019, 7:44 AM

## 2019-09-23 NOTE — Progress Notes (Signed)
Initial Nutrition Assessment  DOCUMENTATION CODES:   Not applicable  INTERVENTION:   Tube feeding via OG tube: D/C Vital High Protein  Vital 1.5 at 50 ml/h (1200 ml per day) Pro-stat 30 ml TID MVI with minerals daily  Provides 2100 kcal, 126 gm protein, 912 ml free water daily   NUTRITION DIAGNOSIS:   Inadequate oral intake related to inability to eat as evidenced by NPO status.  GOAL:   Provide needs based on ASPEN/SCCM guidelines  MONITOR:   TF tolerance  REASON FOR ASSESSMENT:   Consult, Ventilator Enteral/tube feeding initiation and management  ASSESSMENT:   Pt with PMH of DM, HTN, mild dementia who was admitted 6/6 with bilateral occluded vertebral and basilar arteries.   Pt discussed during ICU rounds and with RN.   6/7 MRI showed progressive pontine infarcts; pt intubated and tx to [facility] ICU   Patient is currently intubated on ventilator support MV: 9.3 L/min Temp (24hrs), Avg:99.4 F (37.4 C), Min:98.8 F (37.1 C), Max:100.4 F (38 C)  Medications reviewed Labs reviewed  TF: Vital High Protein @ 20 ml/hr via OG tube   Diet Order:   Diet Order            Diet NPO time specified  Diet effective now              EDUCATION NEEDS:   No education needs have been identified at this time  Skin:  Skin Assessment: (MASD: groin)  Last BM:  unknown  Height:   Ht Readings from Last 1 Encounters:  09/21/19 6' (1.829 m)    Weight:   Wt Readings from Last 1 Encounters:  09/21/19 93 kg    Ideal Body Weight:  80.9 kg  BMI:  There is no height or weight on file to calculate BMI.  Estimated Nutritional Needs:   Kcal:  2029  Protein:  115-130 grams  Fluid:  2 L/day  Lockie Pares., RD, LDN, CNSC See AMiON for contact information

## 2019-09-23 NOTE — Progress Notes (Signed)
STROKE TEAM PROGRESS NOTE   INTERVAL HISTORY I personally reviewed history of presenting illness, electronic medical records and imaging films in PACS.  Patient presented to [facility] outside TPA window with slurred speech left hemiparesis and initial MRI showed patchy right pontine infarct and CT angiogram suggested poor flow in terminal bilateral vertebral arteries and basilar arteries.  Patient was not felt to be a candidate for endovascular intervention and admitted to the floor but overnight had neurological worsening with repeat MRI showing extension of pontine and brainstem infarct.  Patient transferred to [facility] for consideration for intervention but neurological exam was too poor to consider intervention.  Overnight his neurological exam remains poor.  He remains comatose unresponsive.  Is having only few breaths above ventilator settings and a weak cough and gag and sluggish reactive pupils.  No motor purposeful movements.  He remains on full ventilatory support for respiratory failure.  Vitals:   09/23/19 0700 09/23/19 0800 09/23/19 0826 09/23/19 0900  BP: (!) 111/46 (!) 111/49 (!) 109/48 (!) 108/41  Pulse:   68   Resp: 16 16 16 16   Temp:  (!) 100.4 F (38 C)    TempSrc:      SpO2:   100%     CBC:  Recent Labs  Lab 09/21/19 1528 09/21/2019 1601  WBC 10.5 11.5*  NEUTROABS 8.3*  --   HGB 11.9* 11.2*  HCT 37.0* 36.5*  MCV 86.2 89.2  PLT 300 716    Basic Metabolic Panel:  Recent Labs  Lab 10/10/2019 0525 09/23/19 0604  NA 141 142  K 3.6 4.5  CL 111 110  CO2 21* 19*  GLUCOSE 145* 146*  BUN 16 20  CREATININE 1.21 1.17  CALCIUM 8.2* 7.8*   Lipid Panel:     Component Value Date/Time   CHOL 170 09/16/2019 0525   TRIG 100 09/30/2019 0525   HDL 32 (L) 09/20/2019 0525   CHOLHDL 5.3 10/12/2019 0525   VLDL 20 09/17/2019 0525   LDLCALC 118 (H) 09/16/2019 0525   HgbA1c:  Lab Results  Component Value Date   HGBA1C 6.7 (H) 10/08/2019   Urine  Drug Screen: No results found for: LABOPIA, COCAINSCRNUR, LABBENZ, AMPHETMU, THCU, LABBARB  Alcohol Level No results found for: ETH  IMAGING past 24 hours DG Abd 1 View  Result Date: 10/12/2019 CLINICAL DATA:  Orogastric tube placement EXAM: ABDOMEN - 1 VIEW COMPARISON:  Portable exam 1003 hours compared to 01/11/2006 FINDINGS: Tip of endotracheal tube projects over pylorus or duodenal bulb. Visualized bowel gas pattern normal. LEFT basilar atelectasis. Bones demineralized. IMPRESSION: Tip of orogastric tube projects over pylorus or duodenal bulb, consider withdrawal 5 cm. Electronically Signed   By: Lavonia Dana M.D.   On: 09/19/2019 10:39   DG CHEST PORT 1 VIEW  Result Date: 09/23/2019 CLINICAL DATA:  Endotracheally intubated. Additional provided: Stroke EXAM: PORTABLE CHEST 1 VIEW COMPARISON:  Prior chest radiograph 10/04/2019 and earlier. FINDINGS: The ET tube terminates 1.3 cm above the level of the carina. An enteric tube passes below the level of left hemidiaphragm with tip excluded from the field of view. Heart size within normal limits. Persistent opacity within the medial left lung base which may reflect atelectasis. Aspiration is difficult to exclude. The right lung is clear. No definite pleural effusion. No evidence of pneumothorax. IMPRESSION: ET tube 1.3 cm above the level of the carina. An enteric tube passes below the level of the left hemidiaphragm with tip excluded from the field of view.  Persistent opacity within the medial left lung base which may reflect atelectasis. Aspiration is difficult to exclude. Electronically Signed   By: Kellie Simmering DO   On: 09/23/2019 08:40   DG Chest Port 1 View  Result Date: 10/06/2019 CLINICAL DATA:  Intubation EXAM: PORTABLE CHEST 1 VIEW COMPARISON:  Portable exam 1004 hours compared to 0636 hours FINDINGS: Tip of endotracheal tube projects 1.6 cm above carina. Nasogastric tube extends into stomach Normal heart size, mediastinal contours, and pulmonary  vascularity. Small LEFT pleural effusion and basilar atelectasis. Remaining lungs clear. No pneumothorax or acute osseous findings. IMPRESSION: Satisfactory endotracheal tube position. Small LEFT pleural effusion and LEFT basilar atelectasis. Electronically Signed   By: Lavonia Dana M.D.   On: 09/29/2019 10:38   ECHOCARDIOGRAM COMPLETE  Result Date: 09/28/2019    ECHOCARDIOGRAM REPORT   Patient Name:   Cary Lothrop Villena Sr. Date of Exam: 09/21/2019 Medical Rec #:  [id number]           Height:       72.0 in Accession #:    [id number]          Weight:       205.0 lb Date of Birth:  01/22/1936            BSA:          2.153 m Patient Age:    84 years            BP:           106/53 mmHg Patient Gender: M                   HR:           67 bpm. Exam Location:  Inpatient Procedure: 2D Echo, Cardiac Doppler and Color Doppler Indications:    Stroke 434.91 / I163.9  History:        Patient has no prior history of Echocardiogram examinations.                 Risk Factors:Hypertension and Diabetes.  Sonographer:    Jonelle Sidle Dance Referring Phys: [place] Comments: Echo performed with patient supine and on artificial respirator. IMPRESSIONS  1. Left ventricular ejection fraction, by estimation, is 55 to 60%. The left ventricle has normal function. The left ventricle has no regional wall motion abnormalities. There is mild left ventricular hypertrophy. Left ventricular diastolic parameters are consistent with Grade I diastolic dysfunction (impaired relaxation).  2. Right ventricular systolic function is normal. The right ventricular size is normal.  3. Left atrial size was mildly dilated.  4. The mitral valve is grossly normal. Trivial mitral valve regurgitation.  5. The aortic valve is tricuspid. Aortic valve regurgitation is not visualized.  6. Aortic dilatation noted. Aneurysm of the ascending aorta, measuring 40 mm. There is mild dilatation at the level of the sinuses of Valsalva measuring 41 mm.  7. The  inferior vena cava is normal in size with <50% respiratory variability, suggesting right atrial pressure of 8 mmHg. FINDINGS  Left Ventricle: Left ventricular ejection fraction, by estimation, is 55 to 60%. The left ventricle has normal function. The left ventricle has no regional wall motion abnormalities. The left ventricular internal cavity size was normal in size. There is  mild left ventricular hypertrophy. Left ventricular diastolic parameters are consistent with Grade I diastolic dysfunction (impaired relaxation). Indeterminate filling pressures. Right Ventricle: The right ventricular size is normal. No increase in right ventricular wall thickness. Right  ventricular systolic function is normal. Left Atrium: Left atrial size was mildly dilated. Right Atrium: Right atrial size was normal in size. Pericardium: There is no evidence of pericardial effusion. Mitral Valve: The mitral valve is grossly normal. Trivial mitral valve regurgitation. Tricuspid Valve: The tricuspid valve is grossly normal. Tricuspid valve regurgitation is trivial. Aortic Valve: The aortic valve is tricuspid. Aortic valve regurgitation is not visualized. Pulmonic Valve: The pulmonic valve was grossly normal. Pulmonic valve regurgitation is trivial. Aorta: Aortic dilatation noted. There is mild dilatation at the level of the sinuses of Valsalva measuring 41 mm. There is an aneurysm involving the ascending aorta. The aneurysm measures 40 mm. Venous: The inferior vena cava is normal in size with less than 50% respiratory variability, suggesting right atrial pressure of 8 mmHg. IAS/Shunts: No atrial level shunt detected by color flow Doppler.  LEFT VENTRICLE PLAX 2D LVIDd:         4.02 cm  Diastology LVIDs:         2.99 cm  LV e' lateral:   6.64 cm/s LV PW:         1.19 cm  LV E/e' lateral: 6.9 LV IVS:        1.03 cm  LV e' medial:    5.66 cm/s LVOT diam:     2.30 cm  LV E/e' medial:  8.1 LV SV:         69 LV SV Index:   32 LVOT Area:     4.15  cm  RIGHT VENTRICLE             IVC RV Basal diam:  3.13 cm     IVC diam: 1.82 cm RV Mid diam:    2.39 cm RV S prime:     10.90 cm/s TAPSE (M-mode): 2.3 cm LEFT ATRIUM             Index       RIGHT ATRIUM           Index LA diam:        4.20 cm 1.95 cm/m  RA Area:     19.30 cm LA Vol (A2C):   62.1 ml 28.84 ml/m RA Volume:   51.60 ml  23.96 ml/m LA Vol (A4C):   85.3 ml 39.62 ml/m LA Biplane Vol: 73.0 ml 33.90 ml/m  AORTIC VALVE LVOT Vmax:   75.40 cm/s LVOT Vmean:  48.200 cm/s LVOT VTI:    0.167 m  AORTA Ao Root diam: 4.10 cm Ao Asc diam:  4.00 cm MITRAL VALVE MV Area (PHT): 2.22 cm    SHUNTS MV Decel Time: 342 msec    Systemic VTI:  0.17 m MV E velocity: 46.10 cm/s  Systemic Diam: 2.30 cm MV A velocity: 58.70 cm/s MV E/A ratio:  0.79 Lyman Bishop MD Electronically signed by Lyman Bishop MD Signature Date/Time: 09/21/2019/5:00:59 PM    Final     PHYSICAL EXAM Elderly Caucasian male who is intubated.  Not on sedation. . Afebrile. Head is nontraumatic. Neck is supple without bruit.    Cardiac exam no murmur or gallop. Lungs are clear to auscultation. Distal pulses are well felt. Neurological Exam :  Comatose and unresponsive.  He is intubated not on sedation.  Eyes are closed.  Pupils 2 mm very sluggishly reactive.  Eyes in primary position.  Doll's eye movements are absent.  Corneal reflexes are absent bilaterally.  He has a weak cough and gag.  No spontaneous extremity movements.  No response to sternal  rub.  No response in both upper extremities to nailbed pressure.  Semi-purposeful withdrawal in lower extremities to noxious stimuli.  Both plantars are upgoing. ASSESSMENT/PLAN Mr. Allard Lightsey Blancett Sr. is a 84 y.o. male with history of HTN, DB presenting to [facility] with L sided weakness, slurred speech. MRI w/ paramedial pontine and R cerebellar infarcts with BA flow void. Neuro worsening overnight no longer reposnding to verbal stimuli, sternal rub. Progressive respiratory  decline. Transferred to ICU and intubated for airway progection. MRI confirmed progressive B posterior circulation infarcts w/ R VA and BA thrombosis. Transferred to [employer] for further evaluation.   Stroke:   Progressive posterior circulation infarcts secondary to R VA and BA thrombosis, infarcts likely embolic d/t unclear source in setting of posterior circulation large vessel disease  CT head [facility] 09/21/19 1659) No acute abnormality. Small vessel disease. Atrophy. Sinus dz and mastoid effusions.  CTA head [facility] 09/21/19 1659) B V4 occlusions. Proximal and distal BA near occluded / occlusion w/ mild irregular flos mid BA. R PCA patent but small and irregular. Fetal L PCA patent. L M2 inferior division mid w/ moderate / severe stenosis/  CTA neck [facility] 09/21/19 1659) R ICA bifurcation irreg 50-60% stenosis. L ICA bifurcation and proximal L ICA mixed plaque. R VA origin mild to moderate narrowing  MRI  [facility] 09/21/19 2015) Patchy small central and R paramedian pontine and R cerebellar infarcts. BA flow void. Small vessel disease. Atrophy.   CT head [facility] 09/21/19 2253) pontine and R cerebellar infarcts seen on MRI not seen on CT. No new ischemia. Small vessel disease. Atrophy.   MRI  [facility] 10/04/2019 [phone]) progressive B pontine infarcts w/ R VA & BA thrombosis   CT head (. [facility] 09/23/19 1318) continued progression of pontine and R cerebellar infarcts, now include B midbrain, superior cerebellar hemispheres extending into middle cerebellar peduncles. No hydrocephalus. Small vessel disease. Atrophy. L frontal meningioma  2D Echo EF 55-60%. No source of embolus. LA mildly dilated.  LDL 118  HgbA1c 6.7  Lovenox 40 mg sq daily for VTE prophylaxis  aspirin 81 mg daily and clopidogrel 75 mg daily  prior to admission, now on No antithrombotic. Added aspirin 300 supp daily  Therapy recommendations:  pending   Disposition:  pending  Neuro prognosis grim. Dr. Leonie Man discussed with daughter-in-law who will discuss further with family/son, to consider comfort care   Acute Respiratory Failure  Secondary to stroke  Intubated  CCM on board  Hypertension  Home meds:  Metoprolol 25  Stable . Permissive hypertension (OK if < 220/120) but gradually normalize in 5-7 days . Avoid hypotension given intracranial occlusion   Hyperlipidemia  Home meds:  lipitor 80  LDL 118, goal < 70  Resume statin based on plan of care and po access  Diabetes type II Controlled  Home meds:  glucophage 500 bid  HgbA1c 6.7, goal < 7.0  CBGs Recent Labs    10/10/2019 2323 09/23/19 0324 09/23/19 0807  GLUCAP 121* 146* 119*      SSI  Dysphagia . Secondary to stroke . NPO   Other Stroke Risk Factors  Advanced age  Other Active Problems  Baseline memory deficit on namenda  Leukocytosis 10.5->11.5. low grade temp 100.5.  Normocytic anemia   BPH w/ blood tinged urine. Suspected trauma during foley placement  Hospital day #  1  Unfortunate gentleman with mild baseline dementia presented with dysarthria and left hemiparesis with initial MRI showing small pontine infarct but CT angiogram suggesting bilateral terminal vertebral artery and basilar artery thrombosis.  Patient was not felt to be a candidate for thrombectomy initially but overnight had neurological worsening and transferred here for further care unfortunately his neurological exam at the present time is very poor with near brain death exam hence is not a candidate for thrombectomy at the present time.  Plan repeat CT scan of the head to look for further interval worsening of posterior circulation ischemia.  I had a long discussion over the phone with the patient's daughter-in-law who is the spokesperson for the family about his  horrible prognosis and did not recommend prolonged ventilatory support as his chances of making any meaningful recovery is negligible.  Family will discuss this and let us know about their plan about CODE STATUS and withdrawal of care hopefully soon.  Discussed with Dr. Audria Nine critical care medicine This patient is critically ill and at significant risk of neurological worsening, death and care requires constant monitoring of vital signs, hemodynamics,respiratory and cardiac monitoring, extensive review of multiple databases, frequent neurological assessment, discussion with family, other specialists and medical decision making of high complexity.I have made any additions or clarifications directly to the above note.This critical care time does not reflect procedure time, or teaching time or supervisory time of PA/NP/Med Resident etc but could involve care discussion time.  I spent 50 minutes of neurocritical care time  in the care of  this patient. Antony Contras, MD     To contact Stroke Continuity provider, please refer to [email or web address] After hours, contact General Neurology

## 2019-09-23 NOTE — Progress Notes (Signed)
Wasted 175ml of Fentanyl in sink with Merrilee Seashore, RN.

## 2019-09-23 NOTE — Progress Notes (Signed)
OT Cancellation Note  Patient Details Name: Jorge Stalvey Ector Sr. MRN: [id number] DOB: 1936/01/07   Cancelled Treatment:    Reason Eval/Treat Not Completed: Patient not medically ready(Sedated. Intubated.)  [facility]  Maurie Boettcher, OT/L   Acute OT Clinical Specialist Acute Rehabilitation Services Pager [phone] Office [phone]  09/23/2019, 8:29 AM

## 2019-09-23 NOTE — Progress Notes (Signed)
Patient transported to CT on vent, no issues.

## 2019-09-24 LAB — COMPREHENSIVE METABOLIC PANEL
ALT: 14 U/L (ref 0–44)
AST: 21 U/L (ref 15–41)
Albumin: 2.5 g/dL — ABNORMAL LOW (ref 3.5–5.0)
Alkaline Phosphatase: 44 U/L (ref 38–126)
Anion gap: 9 (ref 5–15)
BUN: 29 mg/dL — ABNORMAL HIGH (ref 8–23)
CO2: 24 mmol/L (ref 22–32)
Calcium: 7.7 mg/dL — ABNORMAL LOW (ref 8.9–10.3)
Chloride: 111 mmol/L (ref 98–111)
Creatinine, Ser: 1.21 mg/dL (ref 0.61–1.24)
GFR calc Af Amer: >60 mL/min (ref >60)
GFR calc non Af Amer: 55 mL/min — ABNORMAL LOW (ref >60)
Glucose, Bld: 156 mg/dL — ABNORMAL HIGH (ref 70–99)
Potassium: 3.8 mmol/L (ref 3.5–5.1)
Sodium: 144 mmol/L (ref 135–145)
Total Bilirubin: 0.7 mg/dL (ref 0.3–1.2)
Total Protein: 5.5 g/dL — ABNORMAL LOW (ref 6.5–8.1)

## 2019-09-24 LAB — CBC
HCT: 34.7 % — ABNORMAL LOW (ref 39.0–52.0)
Hemoglobin: 10.7 g/dL — ABNORMAL LOW (ref 13.0–17.0)
MCH: 27.5 pg (ref 26.0–34.0)
MCHC: 30.8 g/dL (ref 30.0–36.0)
MCV: 89.2 fL (ref 80.0–100.0)
Platelets: 228 10*3/uL (ref 150–400)
RBC: 3.89 MIL/uL — ABNORMAL LOW (ref 4.22–5.81)
RDW: 14.8 % (ref 11.5–15.5)
WBC: 8.4 10*3/uL (ref 4.0–10.5)
nRBC: 0 % (ref 0.0–0.2)

## 2019-09-24 LAB — GLUCOSE, CAPILLARY
Glucose-Capillary: 129 mg/dL — ABNORMAL HIGH (ref 70–99)
Glucose-Capillary: 126 mg/dL — ABNORMAL HIGH (ref 70–99)

## 2019-09-24 LAB — MAGNESIUM: Magnesium: 1.8 mg/dL (ref 1.7–2.4)

## 2019-09-24 MED ORDER — MORPHINE BOLUS VIA INFUSION
5.0000 mg | INTRAVENOUS | Status: DC | PRN
  Filled 2019-09-24: qty 5

## 2019-09-24 MED ORDER — ACETAMINOPHEN 325 MG PO TABS: 650.0000 mg | ORAL_TABLET | Freq: Four times a day (QID) | ORAL | Status: DC | PRN

## 2019-09-24 MED ORDER — MORPHINE 100MG IN NS 100ML (1MG/ML) PREMIX INFUSION
0.0000 mg/h | INTRAVENOUS | Status: DC
  Administered 2019-09-24: 8 mg/h via INTRAVENOUS
  Administered 2019-09-24: 5 mg/h via INTRAVENOUS
  Administered 2019-09-25: 8 mg/h via INTRAVENOUS
  Filled 2019-09-24 (×3): qty 100

## 2019-09-24 MED ORDER — GLYCOPYRROLATE 0.2 MG/ML IJ SOLN
0.2000 mg | INTRAMUSCULAR | Status: DC | PRN
  Administered 2019-09-24: 0.2 mg via INTRAVENOUS
  Filled 2019-09-24: qty 1

## 2019-09-24 MED ORDER — MORPHINE SULFATE (PF) 2 MG/ML IV SOLN: 2.0000 mg | INTRAVENOUS | Status: DC | PRN

## 2019-09-24 MED ORDER — DEXTROSE 5 % IV SOLN: INTRAVENOUS | Status: DC

## 2019-09-24 MED ORDER — ACETAMINOPHEN 650 MG RE SUPP: 650.0000 mg | Freq: Four times a day (QID) | RECTAL | Status: DC | PRN

## 2019-09-24 MED ORDER — POLYVINYL ALCOHOL 1.4 % OP SOLN
1.0000 [drp] | Freq: Four times a day (QID) | OPHTHALMIC | Status: DC | PRN
  Filled 2019-09-24: qty 15

## 2019-09-24 MED ORDER — GLYCOPYRROLATE 0.2 MG/ML IJ SOLN: 0.2000 mg | INTRAMUSCULAR | Status: DC | PRN

## 2019-09-24 MED ORDER — GLYCOPYRROLATE 1 MG PO TABS
1.0000 mg | ORAL_TABLET | ORAL | Status: DC | PRN
  Filled 2019-09-24: qty 1

## 2019-09-24 MED ORDER — MIDAZOLAM HCL 2 MG/2ML IJ SOLN
2.0000 mg | INTRAMUSCULAR | Status: DC | PRN
  Administered 2019-09-24 (×2): 2 mg via INTRAVENOUS
  Filled 2019-09-24 (×2): qty 2

## 2019-09-24 MED ORDER — DIPHENHYDRAMINE HCL 50 MG/ML IJ SOLN: 25.0000 mg | INTRAMUSCULAR | Status: DC | PRN

## 2019-09-24 NOTE — Progress Notes (Signed)
PT Cancellation Note  Patient Details Name: Jorge Chiquito Froman Sr. MRN: [id number] DOB: 1935/08/13   Cancelled Treatment:    Reason Eval/Treat Not Completed: PT screened, no needs identified, will sign off. RN Amy reporting plan for withdrawal of care today. PT will sign off at this time.   Zenaida Niece 09/24/2019, 9:18 AM

## 2019-09-24 NOTE — Progress Notes (Signed)
Nutrition Brief Note  Chart reviewed. Pt now transitioning to comfort care.  No further nutrition interventions warranted at this time.  Please re-consult as needed.   Debany Vantol W, RD, LDN, CDCES Registered Dietitian II Certified Diabetes Care and Education Specialist Please refer to AMION for RD and/or RD on-call/weekend/after hours pager  

## 2019-09-24 NOTE — Procedures (Signed)
Extubation Procedure Note  Patient Details:   Name: Jorge STUCKI Sr. DOB: February 16, 1936 MRN: [id number]   Airway Documentation:    Vent end date: 09/24/19 Vent end time: 1155   Evaluation  O2 sats: currently acceptable Complications: Complications of comfort care extubation per family wishes. Patient did tolerate procedure well. Bilateral Breath Sounds: Clear, Diminished   No   Patient extubated to comfort care per order and family wishes. Positive cuff leak was faint but noted prior to extubation. Grandson and RN are at patient bedside. RT will continue to monitor.   [place] Current Clyda Greener 09/24/2019, 12:04 PM

## 2019-09-24 NOTE — Progress Notes (Signed)
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 Intubated, transferred from [facility] to [facility] 6/7   History of present illness   History obtained from chart review  84 yo M PMH HTN Dm2 BPH mild dementia who presented to [facility] ED with CC L sided weakness after being identified by family. LKN 14 hrs prior to presentation. Additional sx include slurred speech, but the onset of this sx is unclear to family. Underwent stroke workup at [facility] and found to have bilateral occluded vertebral and basilar arteries, admitted.  6/6 overnight, pt noted to be aphasic, found to have increased r sided weakness. 6/7 AM noted to have snoring respirations.  MRI Brain obtained 6/7, showing progressivepontine infarcts. Pt intubated for airway protection, transferred to ICU. Then transferred to [facility] ICU for further care.   Past Medical History  HTN DM2 BPH Mild dementia  Significant Hospital Events   6/6 admitted to [facility] for vertebral artery and basilar artery occlusion 6/7 Intubated, progressive pontine infarcts, transferred to [facility] ICU   Consults:  PCCM   Procedures:  ETT 6/7>> 6/9  Significant Diagnostic Tests:  CTA head neck> Dominant R vertebral artery, mild-moderate narrowing of R vertebral artery. Basilar artery appears occluded. R posterior cerebral artery appears patent. Moderate-severe stenosis within L MCA branch vessel  MRI brain  6/6> Small volume acute pontine infarcts, without mass effect  MRI Brain 6/7> progressive bilateral acute pontine infarcts Echo 6/7:  LVEF 55-60%, mild LVH with grade I diastolic dysfunction, aneurysm of ascending aorta to 17mm  Micro Data:  09/21/19 SARS Cov2> neg  6/7  urine: multiple species-> negative Antimicrobials:    Interim history/subjective:  6/9: tmax 102.3, reports that family wants to transition to comfort care today.  6/8: tmax 100.5, fio2 40%, off sedation since 0500 and unresponsive at this time.  6/7:Transferred to [facility] ICU   Objective   Blood pressure (!) 121/49, pulse 74, temperature 100 F (37.8 C), temperature source Axillary, resp. rate 16, weight 93 kg, SpO2 100 %.    Vent Mode: PRVC FiO2 (%):  [40 %] 40 % Set Rate:  [16 bmp] 16 bmp Vt Set:  [500 mL] 500 mL PEEP:  [5 cmH20] 5 cmH20 Plateau Pressure:  [13 cmH20-16 cmH20] 14 cmH20   Intake/Output Summary (Last 24 hours) at 09/24/2019 0758 Last data filed at 09/24/2019 0600 Gross per 24 hour  Intake 2770.31 ml  Output 750 ml  Net 2020.31 ml   Filed Weights   09/24/19 0500  Weight: 93 kg    Examination: General: Critically ill appearing older adult M, intubated, unresponsive HENT: NCAT , pupils pinpoint bilaterally.  ETT OGT Trachea midline Anicteric sclera Lungs: CTA bilaterally. Symmetrical chest expansion, mechanically ventilated  Cardiovascular: RRR s1s2 no rgm. Cap refill < 3 seconds. 2+ radial pulses  Abdomen: Soft  ndnt + bowel sounds Extremities: Symmetrical muscle bulk BUE BLE. No obvious joint deformity. No cyanosis or clubbing  Neuro: + babinski otherwise unresponsive GU: Foley with blood tinged urine   Resolved Hospital Problem list     Assessment & Plan:   Acute respiratory failure with hypoxia due to inability to protect airway, insetting of vertebral artery and basilar artery occlusion  P -  Continue MV -VAP, PAD bundles -Adjust PEEP/FiO2 for SpO2 > 92%  -on minimal settings but mental status precluding successful extubation.   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    Hx mild dementia -may slightly confound neuro exams  DM2 P -SSI -a1c 6.7  HTN P -ICU monitoring  Normocytic anemia:  -stable  BPH Blood tinged urine -suspect trauma due to foley placement P -cont foley -follow up UA  Inadequate PO intake P -EN per RDN   Best practice:  Diet:  EN per RDN Pain/Anxiety/Delirium protocol (if indicated): comfort measures VAP protocol (if indicated): yes DVT prophylaxis: SCD GI prophylaxis: protonix  Glucose control: Monitor  Mobility: Bedrest Code Status: DNR comfort measures.  Family Communication: updated grandson at bedside.  Disposition: ICU   Labs   CBC: Recent Labs  Lab 09/21/19 1528 09/21/2019 1601 09/24/19 0721  WBC 10.5 11.5* 8.4  NEUTROABS 8.3*  --   --   HGB 11.9* 11.2* 10.7*  HCT 37.0* 36.5* 34.7*  MCV 86.2 89.2 89.2  PLT 300 259 329    Basic Metabolic Panel: Recent Labs  Lab 09/21/19 1528 09/17/2019 0525 09/23/19 0604  NA 138 141 142  K 4.0 3.6 4.5  CL 107 111 110  CO2 23 21* 19*  GLUCOSE 164* 145* 146*  BUN 19 16 20   CREATININE 1.36* 1.21 1.17  CALCIUM 8.5* 8.2* 7.8*   GFR: Estimated Creatinine Clearance: 53.4 mL/min (by C-G formula based on SCr of 1.17 mg/dL). Recent Labs  Lab 09/21/19 1528 09/26/2019 1601 09/24/19 0721  WBC 10.5 11.5* 8.4    Liver Function Tests: Recent Labs  Lab 09/21/19 1528  AST 15  ALT 11  ALKPHOS 56  BILITOT 0.8  PROT 6.9  ALBUMIN 3.7   No results for input(s): LIPASE, AMYLASE in the last 168 hours. No results for input(s): AMMONIA in the last 168 hours.  ABG    Component Value Date/Time   PHART 7.37 09/21/2019 1047   PCO2ART 38 10/06/2019 1047   PO2ART 392 (H) 09/29/2019 1047   HCO3 22.0 10/10/2019 1047   O2SAT 100.0 10/03/2019 1047     Coagulation Profile: Recent Labs  Lab 09/21/19 1528  INR 1.1    Cardiac Enzymes: No results  for input(s): CKTOTAL, CKMB, CKMBINDEX, TROPONINI in the last 168 hours.  HbA1C: Hgb A1c MFr Bld  Date/Time Value Ref Range Status  09/21/2019 05:25 AM 6.7 (H) 4.8 - 5.6 % Final    Comment:    (NOTE) Pre diabetes:          5.7%-6.4% Diabetes:              >6.4% Glycemic control for   <7.0% adults with diabetes     CBG: Recent Labs  Lab 09/23/19 0324 09/23/19 0807 09/23/19 1943 09/23/19 2337 09/24/19 0336  GLUCAP 146* 119* 142* 141* 129*       Critical care time: The patient is critically ill with multiple organ systems failure and requires high complexity decision making for assessment and support, frequent evaluation and titration of therapies, application of advanced monitoring technologies and extensive interpretation of multiple databases.  Critical care time 35 mins. This represents my time independent of the NPs time taking care of the pt. This is excluding  procedures.    [facility] 09/24/2019, 7:58 AM

## 2019-09-24 NOTE — Progress Notes (Signed)
Attempted to see pt for OT eval today.  Spoke with nursing prior to who stated care would be withdrawn today. Will sign off.  Jinger Neighbors, [place] [phone]

## 2019-09-24 NOTE — Progress Notes (Signed)
STROKE TEAM PROGRESS NOTE   INTERVAL HISTORY No significant neurological changes.  His neurological exam remains poor.  He remains comatose unresponsive.  Is having only few breaths above ventilator settings and a weak cough and gag and sluggish reactive pupils.  No motor purposeful movements.  He remains on full ventilatory support for respiratory failure.  I spoke to the patient's daughter-in-law yesterday afternoon and family agree to withdrawal of ventilatory support and comfort care measures only.  His grandson is at the bedside and we plan to withdraw support today  Vitals:   09/24/19 1103 09/24/19 1104 09/24/19 1155 09/24/19 1400  BP: (!) 110/52   (!) 134/55  Pulse: 76   (!) 109  Resp: 16   (!) 8  Temp:      TempSrc:      SpO2: 100% 100% 100% 92%  Weight:        CBC:  Recent Labs  Lab 09/21/19 1528 09/21/19 1528 09/21/2019 1601 09/24/19 0721  WBC 10.5   < > 11.5* 8.4  NEUTROABS 8.3*  --   --   --   HGB 11.9*   < > 11.2* 10.7*  HCT 37.0*   < > 36.5* 34.7*  MCV 86.2   < > 89.2 89.2  PLT 300   < > 259 228   < > = values in this interval not displayed.    Basic Metabolic Panel:  Recent Labs  Lab 09/23/19 0604 09/24/19 0721  NA 142 144  K 4.5 3.8  CL 110 111  CO2 19* 24  GLUCOSE 146* 156*  BUN 20 29*  CREATININE 1.17 1.21  CALCIUM 7.8* 7.7*  MG  --  1.8   Lipid Panel:     Component Value Date/Time   CHOL 170 10/14/2019 0525   TRIG 100 09/29/2019 0525   HDL 32 (L) 10/05/2019 0525   CHOLHDL 5.3 10/15/2019 0525   VLDL 20 09/20/2019 0525   LDLCALC 118 (H) 10/04/2019 0525   HgbA1c:  Lab Results  Component Value Date   HGBA1C 6.7 (H) 09/19/2019   Urine Drug Screen: No results found for: LABOPIA, COCAINSCRNUR, LABBENZ, AMPHETMU, THCU, LABBARB  Alcohol Level No results found for: ETH  IMAGING past 24 hours No results found.  PHYSICAL EXAM Elderly Caucasian male who is intubated.  Not on sedation. . Afebrile. Head is nontraumatic. Neck is supple without  bruit.    Cardiac exam no murmur or gallop. Lungs are clear to auscultation. Distal pulses are well felt. Neurological Exam :  Comatose and unresponsive.  He is intubated not on sedation.  Eyes are closed.  Pupils 2 mm very sluggishly reactive.  Eyes in primary position.  Doll's eye movements are absent.  Corneal reflexes are absent bilaterally.  He has a weak cough and gag.  No spontaneous extremity movements.  No response to sternal rub.  No response in both upper extremities to nailbed pressure.  Semi-purposeful withdrawal in lower extremities to noxious stimuli.  Both plantars are upgoing. ASSESSMENT/PLAN Mr. Alric Geise Gramling Sr. is a 84 y.o. male with history of HTN, DB presenting to [facility] with L sided weakness, slurred speech. MRI w/ paramedial pontine and R cerebellar infarcts with BA flow void. Neuro worsening overnight no longer reposnding to verbal stimuli, sternal rub. Progressive respiratory decline. Transferred to ICU and intubated for airway progection. MRI confirmed progressive B posterior circulation infarcts w/ R VA and BA thrombosis. Transferred to [facility] for further evaluation.  Stroke:   Progressive posterior circulation infarcts secondary to R VA and BA thrombosis, infarcts likely embolic d/t unclear source in setting of posterior circulation large vessel disease  CT head [facility] 09/21/19 1659) No acute abnormality. Small vessel disease. Atrophy. Sinus dz and mastoid effusions.  CTA head [facility] 09/21/19 1659) B V4 occlusions. Proximal and distal BA near occluded / occlusion w/ mild irregular flos mid BA. R PCA patent but small and irregular. Fetal L PCA patent. L M2 inferior division mid w/ moderate / severe stenosis/  CTA neck [facility] 09/21/19 1659) R ICA bifurcation irreg 50-60% stenosis. L ICA bifurcation and proximal L ICA mixed plaque. R VA origin mild to  moderate narrowing  MRI  [facility] 09/21/19 2015) Patchy small central and R paramedian pontine and R cerebellar infarcts. BA flow void. Small vessel disease. Atrophy.   CT head [facility] 09/21/19 2253) pontine and R cerebellar infarcts seen on MRI not seen on CT. No new ischemia. Small vessel disease. Atrophy.   MRI  [facility] 10/13/2019 [phone]) progressive B pontine infarcts w/ R VA & BA thrombosis   CT head ([facility] 09/23/19 1318) continued progression of pontine and R cerebellar infarcts, now include B midbrain, superior cerebellar hemispheres extending into middle cerebellar peduncles. No hydrocephalus. Small vessel disease. Atrophy. L frontal meningioma  2D Echo EF 55-60%. No source of embolus. LA mildly dilated.  LDL 118  HgbA1c 6.7  Lovenox 40 mg sq daily for VTE prophylaxis  aspirin 81 mg daily and clopidogrel 75 mg daily prior to admission, now on No antithrombotic. Added aspirin 300 supp daily  Therapy recommendations:  pending   Disposition:  pending  Neuro prognosis grim. Dr. Leonie Man discussed with daughter-in-law who will discuss further with family/son, to consider comfort care   Acute Respiratory Failure  Secondary to stroke  Intubated  CCM on board  Hypertension  Home meds:  Metoprolol 25  Stable . Permissive hypertension (OK if < 220/120) but gradually normalize in 5-7 days . Avoid hypotension given intracranial occlusion   Hyperlipidemia  Home meds:  lipitor 80  LDL 118, goal < 70  Resume statin based on plan of care and po access  Diabetes type II Controlled  Home meds:  glucophage 500 bid  HgbA1c 6.7, goal < 7.0  CBGs Recent Labs    09/23/19 2337 09/24/19 0336 09/24/19 0805  GLUCAP 141* 129* 126*      SSI  Dysphagia . Secondary to stroke . NPO   Other Stroke Risk Factors  Advanced age  Other Active Problems  Baseline memory deficit on  namenda  Leukocytosis 10.5->11.5. low grade temp 100.5.  Normocytic anemia   BPH w/ blood tinged urine. Suspected trauma during foley placement  Hospital day # 2  Unfortunate gentleman with mild baseline dementia presented with dysarthria and left hemiparesis with initial MRI showing small pontine infarct but CT angiogram suggesting bilateral terminal vertebral artery and basilar artery thrombosis.  Patient was not felt to be a candidate for thrombectomy initially but overnight had neurological worsening and transferred here for further care unfortunately his neurological exam at the present time is very poor with near brain death exam hence is not a candidate for thrombectomy at the present time.  Follow up  CT scan of the head showed continued progression of large posterior circulation ischemic infarcts with progressive infarction changes involving not just the pons but also  midbrain bilaterally and superior cerebellar hemispheres and middle cerebellarl peduncles.  No hemorrhagic conversion.  No hydrocephalus.  I had a long discussion over the phone with the patient's daughter-in-law who is the spokesperson for the family about his horrible prognosis and did not recommend prolonged ventilatory support as his chances of making any meaningful recovery is negligible.  Family will discuss this and let us know about their plan about CODE STATUS and withdrawal of care hopefully soon.  Discussed with Dr. Audria Nine critical care medicine and patient's grandson at the bedside and answered questions.  Family is comfortable with decision to withdraw ventilatory support and patient is DNR. This patient is critically ill and at significant risk of neurological worsening, death and care requires constant monitoring of vital signs, hemodynamics,respiratory and cardiac monitoring, extensive review of multiple databases, frequent neurological assessment, discussion with family, other specialists and medical  decision making of high complexity.I have made any additions or clarifications directly to the above note.This critical care time does not reflect procedure time, or teaching time or supervisory time of PA/NP/Med Resident etc but could involve care discussion time.  I spent 35 minutes of neurocritical care time  in the care of  this patient. Antony Contras, MD     To contact Stroke Continuity provider, please refer to [email or web address] After hours, contact General Neurology

## 2019-10-16 NOTE — Death Summary Note (Signed)
Patient ID: Jorge Navarette Magill Sr. MRN: [id number] DOB/AGE: April 18, 1935 84 y.o.  Admit date: October 17, 2019 Death date: 10-20-19 at 1150 am  Admission Diagnoses: Basilar artery occlusion  Cause of Death: Cardiac arrest due to respiratory failure from large brainstem and cerebellar infarct from basilar artery occlusion  Pertinent Medical Diagnosis: Active Problems:   CVA (cerebral vascular accident) ([place])   Basilar artery embolism Respiratory failure  Hospital Course: Jorge Delpriore Mayotte Sr. is a 84 y.o. male with history of hypertension and diabetes without complications.  Patient was brought to [facility] secondary to left-sided weakness.  Patient at that point was last seen 14 hours before coming to ED.  Per the son his mother noticed him getting out of bed around 10 AM and using the bathroom and after that he went back to bed.  When he came to visit him he noticed left-sided weakness and worsening of slurred speech.  Overnight patient had further worsening.  Patient did not receive TPA on admission secondary to being outside the window.  Initial last known well was 09/21/2019 at approximately 10 PM.  On initial exam while at [facility] per neurology, patient did not respond to verbal stimuli, did not respond to deep sternal rub, did not follow commands with no verbalizations noted.  Patient did not respond to confrontation bilaterally however pupils bilaterally were 2 mm reactive.  Eyes showed roving eye movements.  He did have a left facial droop.  He was flaccid throughout with no movements.  Did not respond to noxious stimuli.  Plantars were upgoing bilaterally.  1 hour after patient was seen, he was found to have respiratory distress with saturations in the 90s on 4 L nasal cannula.  Patient was immediately transferred to ICU.  He was intubated for airway protection.  MRI was immediately obtained and showed progressive bilateral acute pontine infarcts in the setting of right vertebral and  basilar thrombosis.Patient was emergently, transferred to [facility].  CT angiogram of the head showed bilateral distal V4 vertebral artery occlusions as well as basilar artery occlusion proximally and distally with only mild irregular flow noted in the mid basilar artery.  CT head showed extensive infarction involving pons and right cerebellum.  MRI showed massive infarction of the right pons and cerebellum.  Follow-up CT scan the next day showed continued interval progression of acute and subacute posterior circulation ischemic infarcts with progressive infarction changes involving midbrain bilaterally, superior cerebellum and middle cerebellar peduncles. Patient neurological exam was quite poor.  This was explained to the family members.  I spoke to patient's daughter-in-law over the phone was a spokesperson.  Family understood and did not want prolonged ventilatory support, tracheostomy, PEG tube and nursing home which would have been unavoidable.  They agreed to DNR and withdrawal of ventilatory support which was done.  Patient was made comfort care measures only and placed on IV morphine drip.  He was transferred to hospice unit and he passed away peacefully on 20-Oct-2019 at 11:50 AM. Signed: Antony Contras 20-Oct-2019, 3:46 PM

## 2019-10-16 NOTE — Progress Notes (Signed)
STROKE TEAM PROGRESS NOTE   INTERVAL HISTORY Patient is now full comfort care and has been transferred to hospice unit.  He is on morphine drip and resting comfortably.  No family at the bedside.  Vitals:   09/24/19 1104 09/24/19 1155 09/24/19 1400 10/01/2019 0415  BP:   (!) 134/55 128/89  Pulse:   (!) 109 84  Resp:   (!) 8 17  Temp:    98.9 F (37.2 C)  TempSrc:    Oral  SpO2: 100% 100% 92% (!) 69%  Weight:        PHYSICAL EXAM  Elderly Caucasian male who is resting on IV morphine drip.    Afebrile. Head is nontraumatic. Neck is supple without bruit.    Cardiac exam no murmur or gallop. Lungs are clear to auscultation. Distal pulses are well felt. Neurological Exam :  Comatose and unresponsive.  He is sedated on morphine drip.  Eyes are closed.  Pupils 2 mm very sluggishly reactive.  Eyes in primary position.  Doll's eye movements are absent.  Corneal reflexes are absent bilaterally.  He has a weak cough and gag.  No spontaneous extremity movements.  No response to sternal rub.  No response in both upper extremities to nailbed pressure.  Semi-purposeful withdrawal in lower extremities to noxious stimuli.  Both plantars are upgoing.  ASSESSMENT/PLAN Mr. Aldred Mase Popwell Sr. is a 84 y.o. male with history of HTN, DB presenting to [facility] with L sided weakness, slurred speech. MRI w/ paramedial pontine and R cerebellar infarcts with BA flow void. Neuro worsening overnight no longer reposnding to verbal stimuli, sternal rub. Progressive respiratory decline. Transferred to ICU and intubated for airway progection. MRI confirmed progressive B posterior circulation infarcts w/ R VA and BA thrombosis. Transferred to [employer] for further evaluation.   Stroke:   Progressive posterior circulation infarcts secondary to R VA and BA thrombosis, infarcts likely embolic d/t unclear source in setting of posterior circulation large vessel disease  CT head  [facility] 09/21/19 1659) No acute abnormality. Small vessel disease. Atrophy. Sinus dz and mastoid effusions.  CTA head [facility] 09/21/19 1659) B V4 occlusions. Proximal and distal BA near occluded / occlusion w/ mild irregular flos mid BA. R PCA patent but small and irregular. Fetal L PCA patent. L M2 inferior division mid w/ moderate / severe stenosis/  CTA neck [facility] 09/21/19 1659) R ICA bifurcation irreg 50-60% stenosis. L ICA bifurcation and proximal L ICA mixed plaque. R VA origin mild to moderate narrowing  MRI  [facility] 09/21/19 2015) Patchy small central and R paramedian pontine and R cerebellar infarcts. BA flow void. Small vessel disease. Atrophy.   CT head [facility] 09/21/19 2253) pontine and R cerebellar infarcts seen on MRI not seen on CT. No new ischemia. Small vessel disease. Atrophy.   MRI  [facility] 10/07/2019 [phone]) progressive B pontine infarcts w/ R VA & BA thrombosis   CT head ([place] 09/23/19 1318) continued progression of pontine and R cerebellar infarcts, now include B midbrain, superior cerebellar hemispheres extending into middle cerebellar peduncles. No hydrocephalus. Small vessel disease. Atrophy. L frontal meningioma  2D Echo EF 55-60%. No source of embolus. LA mildly dilated.  LDL 118  HgbA1c 6.7  aspirin 81 mg daily and clopidogrel 75 mg daily prior to admission, now on No antithrombotic. Added aspirin 300 supp  daily  Disposition:  pending  Family opted for comfort care, now DNR, on morphine gtt  Acute Respiratory Failure  Secondary to stroke  Terminal wean 6/9   Hypertension  Home meds:  Metoprolol 25  Hyperlipidemia  Home meds:  lipitor 80  LDL 118  Diabetes type II Controlled  Home meds:  glucophage 500 bid  HgbA1c 6.7  Dysphagia . Secondary to stroke . NPO   Other Stroke  Risk Factors  Advanced age  Other Active Problems  Baseline memory deficit on namenda  Leukocytosis w/ low grade temp  Normocytic anemia   BPH w/ blood tinged urine. Suspected trauma during foley placement  Hospital day # 3 Patient prognosis is poor from massive brainstem and cerebellar infarction from basilar occlusion.  He has been made DNR and comfort care measures only upon request from family.  He is on morphine drip.  Anticipate death soon.  No family at the bedside  Antony Contras, MD     To contact Stroke Continuity provider, please refer to [email or web address] After hours, contact General Neurology

## 2019-10-16 NOTE — Social Work (Signed)
CSW acknowledging consult for comfort care. Will follow for disposition should hospice services or residential hospice become appropriate.   Alexander Mt, MSW, [place] [facility]

## 2019-10-16 NOTE — Progress Notes (Signed)
Patient is DNR, full comfort care, noted unresponsive, no pulse, no breathing, expired at 11:50; next of kin Evorn Gong , Dr. Leonie Man and [facility] was notified. Patient has no belongings at bedside.

## 2019-10-16 DEATH — deceased

## 2019-12-23 ENCOUNTER — Ambulatory Visit: Payer: Medicare Other | Admitting: Internal Medicine

## 2020-09-10 IMAGING — CT CT HEAD W/O CM
3 series · 15 of 47 positions shown, 18 images · non-contrast
Comparison: MRI 3 hours ago.  Head and neck CTA earlier today.

CLINICAL DATA: Worsening neuro symptoms, recent ischemic event.
Evaluate for hemorrhagic conversion.

EXAM:
CT HEAD WITHOUT CONTRAST
TECHNIQUE: Contiguous axial images were obtained from the base of the skull
through the vertex without intravenous contrast.

[Series 3: coronal soft tissue · coronal · 0.30mm/px · 3 of 68 slices shown]
[im 23/68  brain]
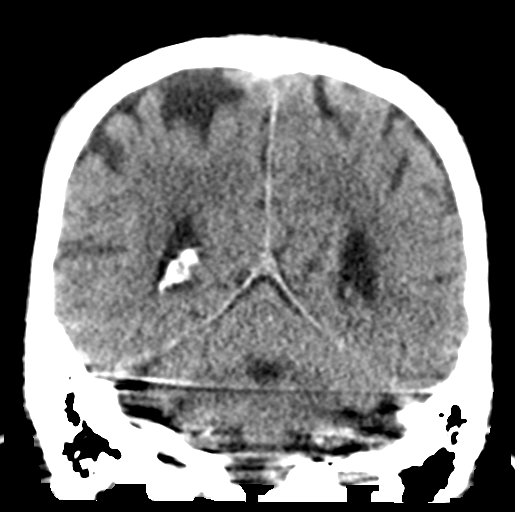
[im 30/68  brain]
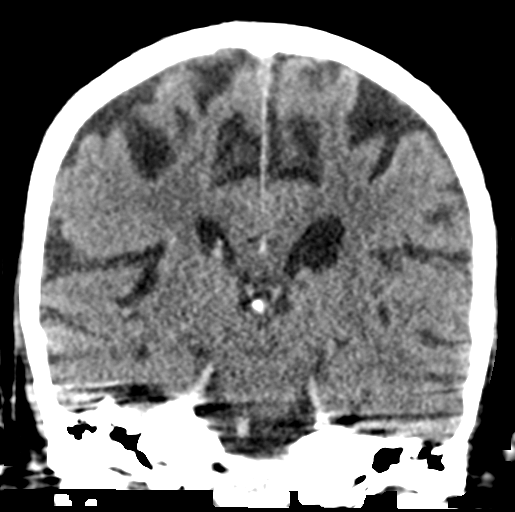
[im 38/68  brain]
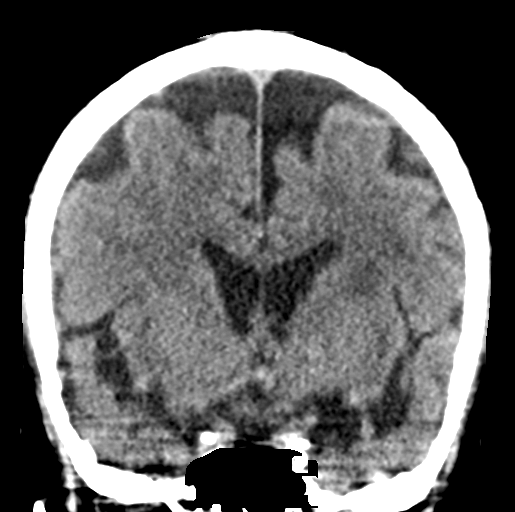

[Series 4: head wo · axial · 0.47mm/px · z∈[-157,-32]mm · 9 of 30 slices shown, 12 images]
[im 3/30  brain]
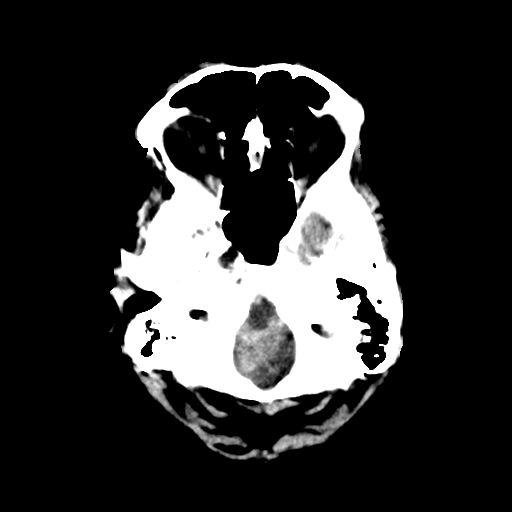
[im 3/30  bone]
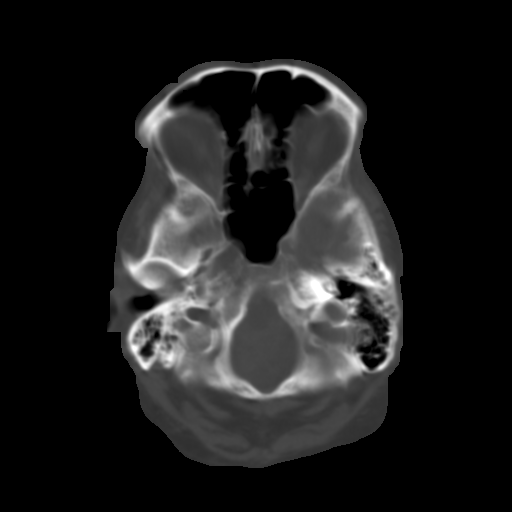
[im 6/30  brain]
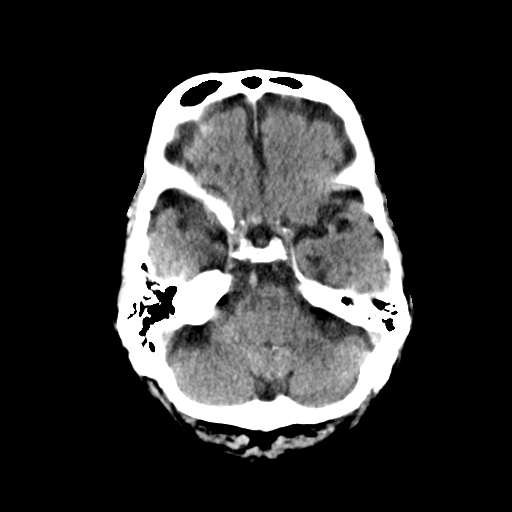
[im 9/30  brain]
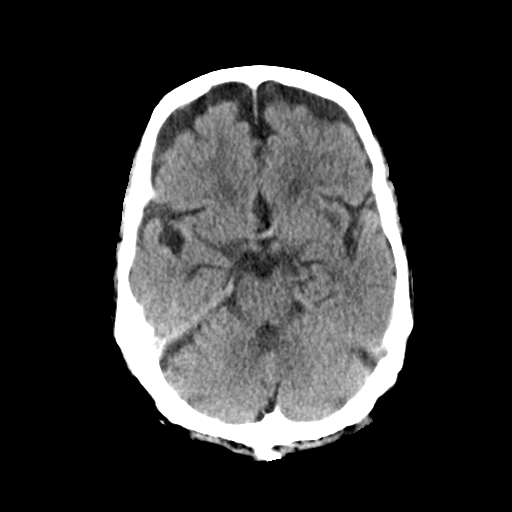
[im 12/30  brain]
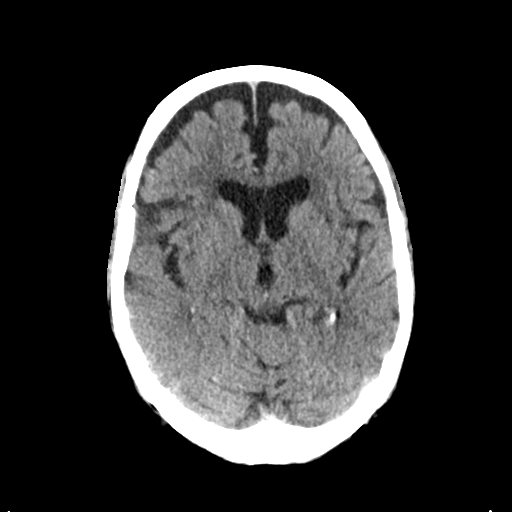
[im 16/30  brain]
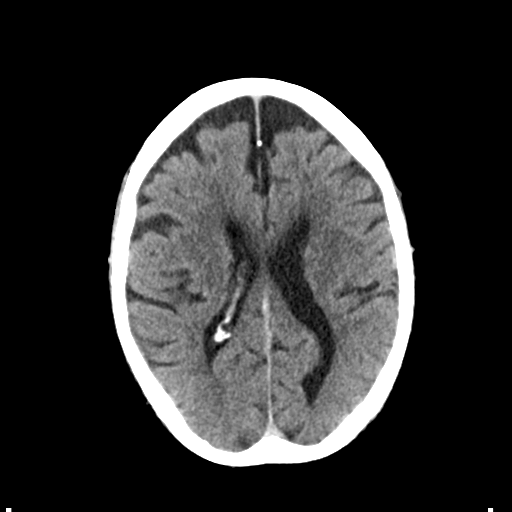
[im 16/30  bone]
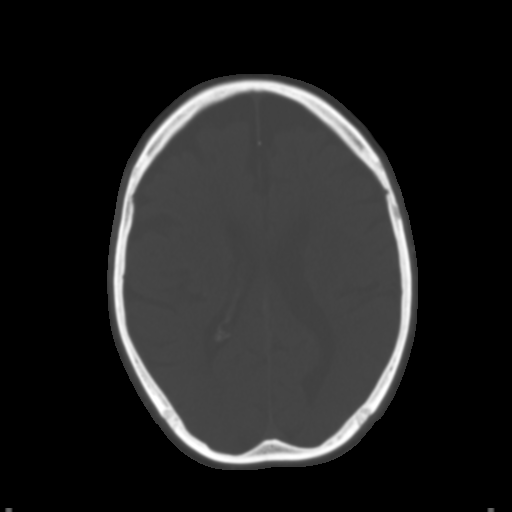
[im 19/30  brain]
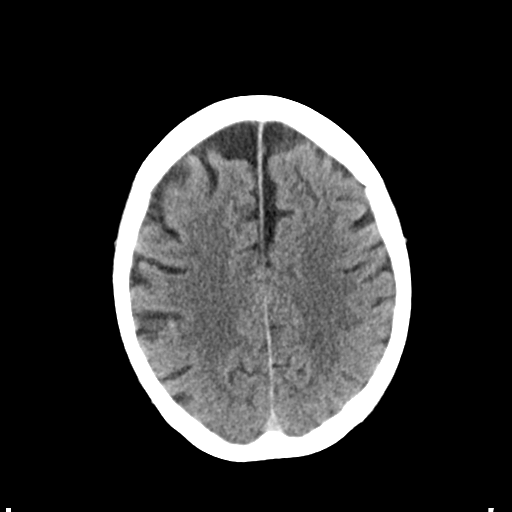
[im 22/30  brain]
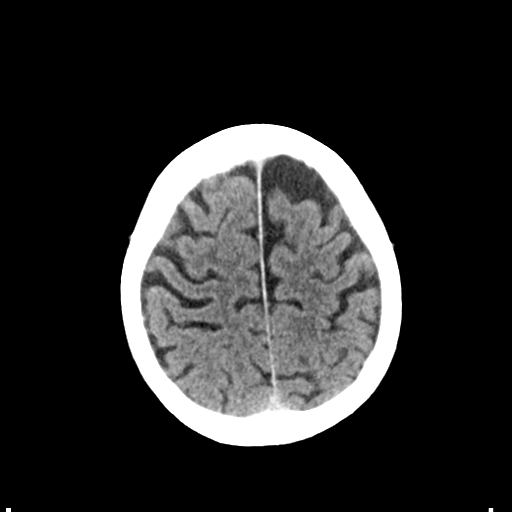
[im 25/30  brain]
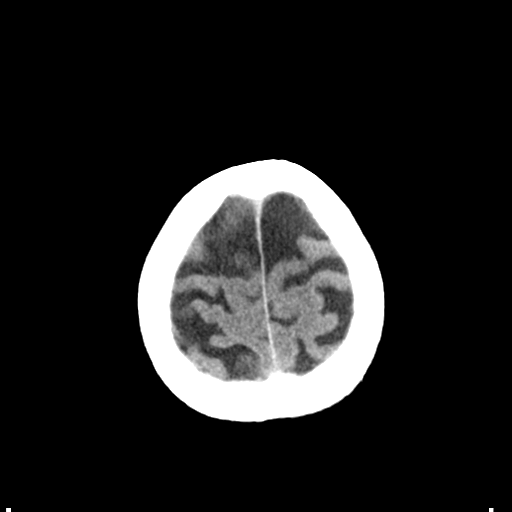
[im 28/30  brain]
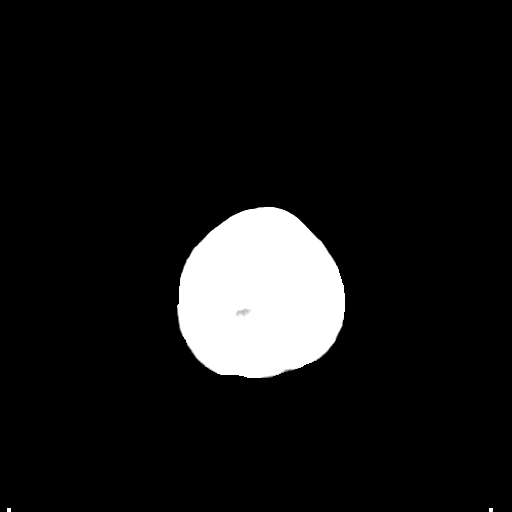
[im 28/30  bone]
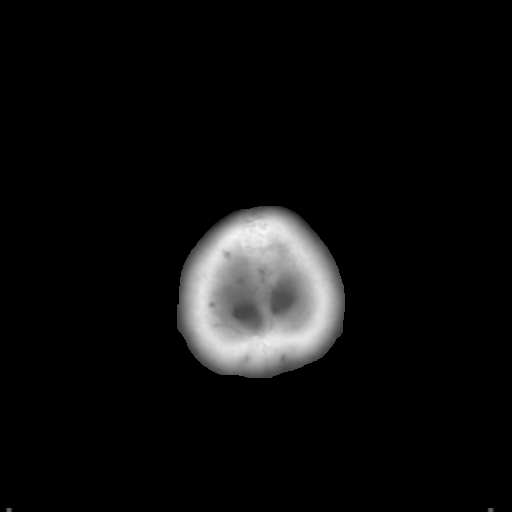

[Series 5: sagittal soft tissue · sagittal · 0.30mm/px · 3 of 52 slices shown]
[im 18/52  brain]
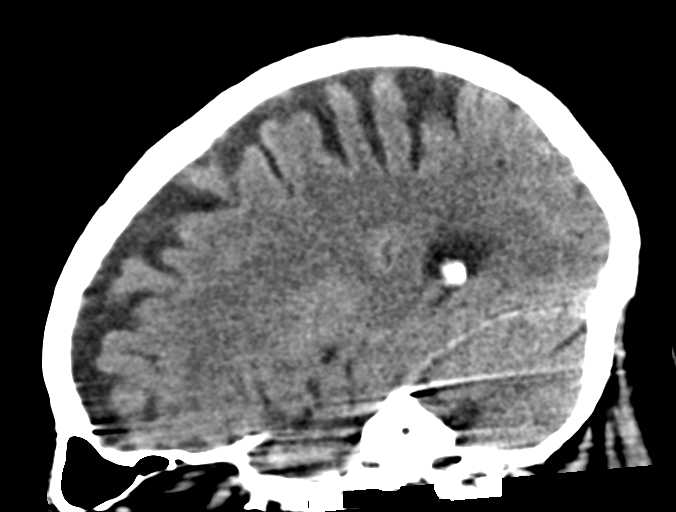
[im 26/52  brain]
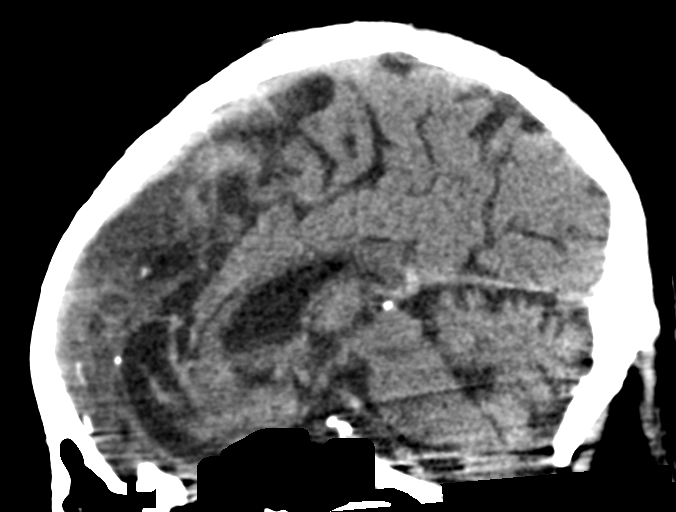
[im 35/52  brain]
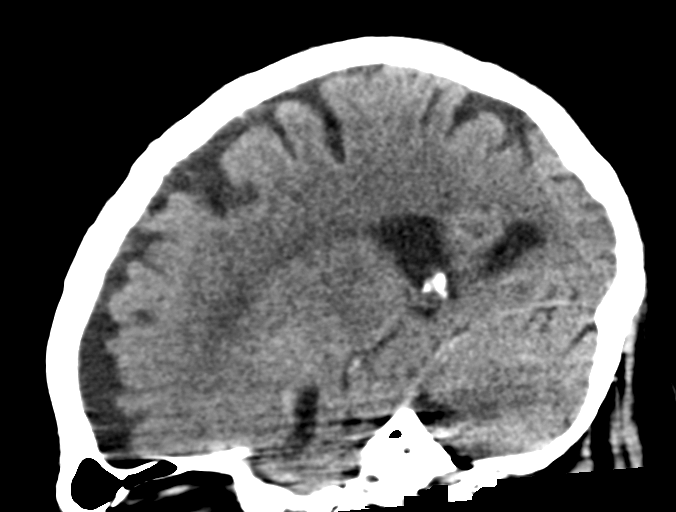

[15 of 47 positions shown; findings below may reference images not displayed]

FINDINGS: Brain: Small foci of restricted diffusion involving the pons and
right cerebellum are not visualized by CT. There is no hemorrhagic
transformation. No acute intraparenchymal, subarachnoid, or subdural
hemorrhage. Stable degree of atrophy and chronic small vessel
ischemia. No evidence of new ischemia from recent MRI.

Vascular: Skull base atherosclerosis. Basilar artery occlusion on
CTA earlier today.

Skull: No fracture or focal lesion.

Sinuses/Orbits: No acute findings.

Other: None.
IMPRESSION: 1. Small foci of restricted diffusion involving the pons and right
cerebellum on MRI are not visualized by CT. There is no hemorrhagic
transformation.
2. No evidence of new ischemia.
3. Stable atrophy and chronic small vessel ischemia.

## 2020-09-11 IMAGING — DX DG CHEST 1V
1 series · 1 of 1 positions shown · non-contrast
Comparison: 09/21/2019

CLINICAL DATA: Wheezing

EXAM:
CHEST  1 VIEW

[chest ap]
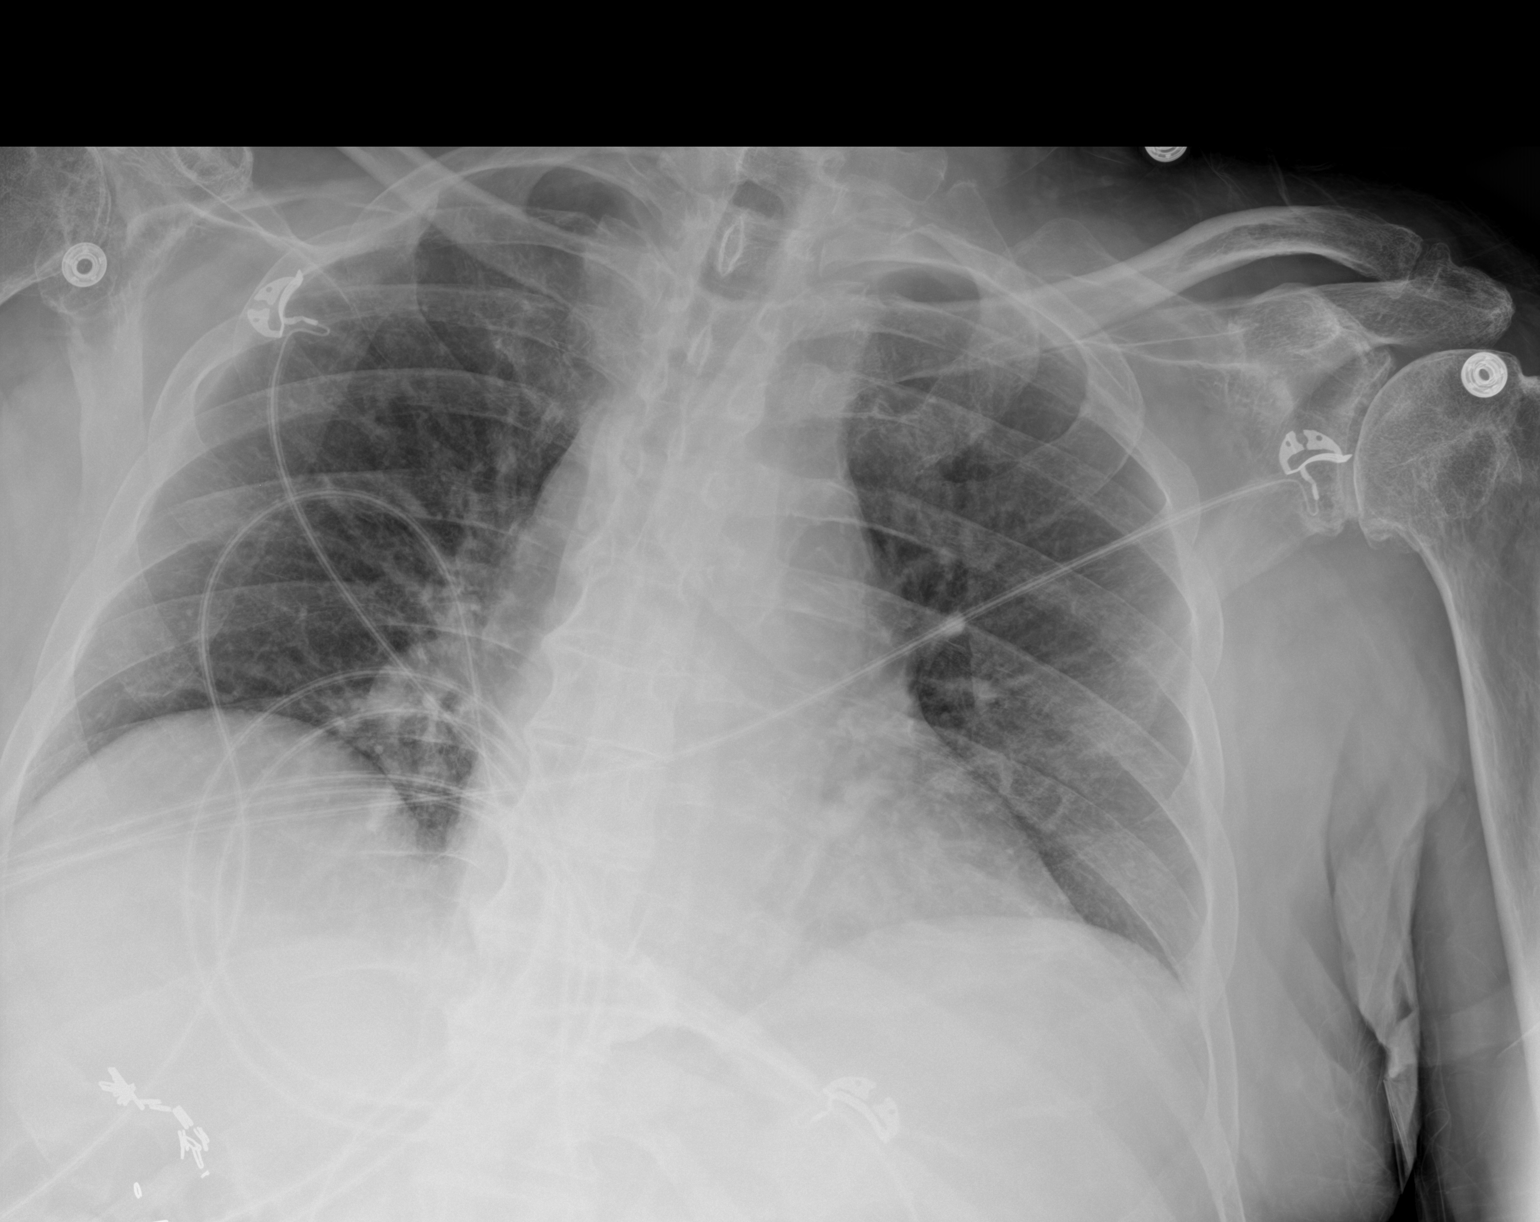

[1 of 1 positions shown; findings below may reference images not displayed]

FINDINGS: Low volume chest. There is no edema, consolidation, effusion, or
pneumothorax. Normal heart size and mediastinal contours.
Cholecystectomy clips.
IMPRESSION: Stable low volume chest.

## 2020-09-12 IMAGING — DX DG CHEST 1V PORT
1 series · 1 of 1 positions shown · non-contrast
Comparison: Prior chest radiograph 09/22/2019 and earlier.

CLINICAL DATA: Endotracheally intubated. Additional provided:
Stroke

EXAM:
PORTABLE CHEST 1 VIEW

[chest ap]
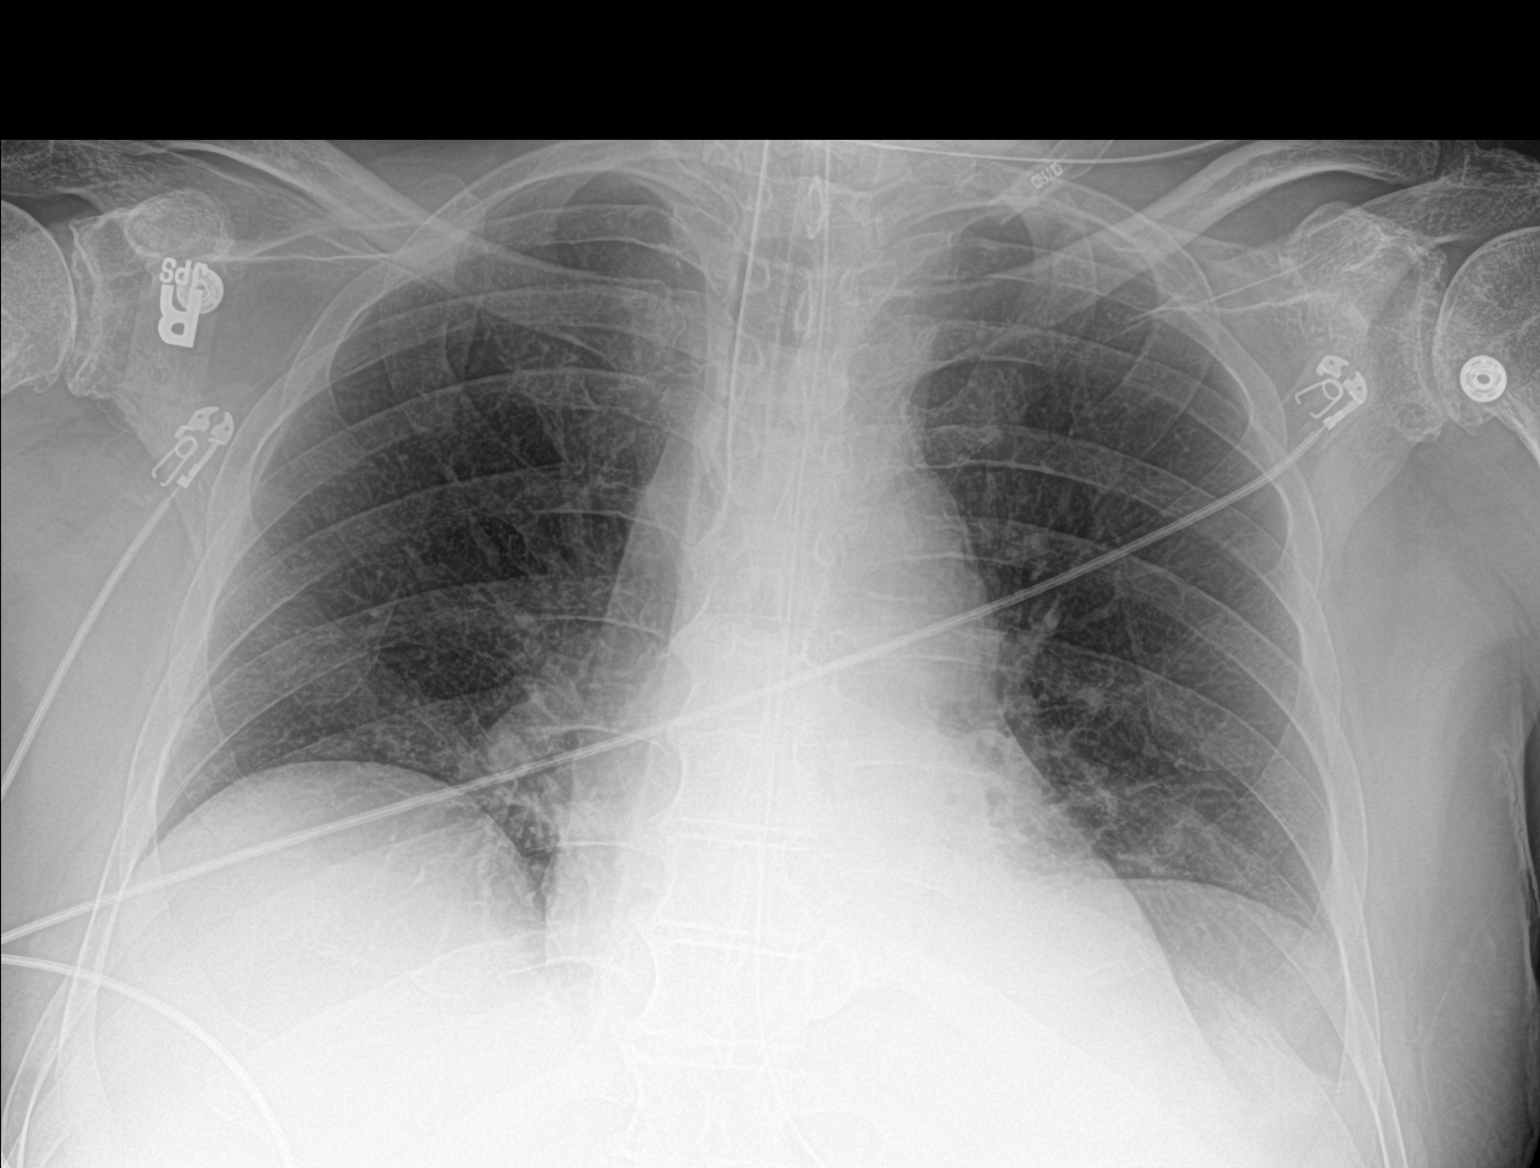

[1 of 1 positions shown; findings below may reference images not displayed]

FINDINGS: The ET tube terminates 1.3 cm above the level of the carina. An
enteric tube passes below the level of left hemidiaphragm with tip
excluded from the field of view.

Heart size within normal limits. Persistent opacity within the
medial left lung base which may reflect atelectasis. Aspiration is
difficult to exclude. The right lung is clear. No definite pleural
effusion. No evidence of pneumothorax.
IMPRESSION: ET tube 1.3 cm above the level of the carina. An enteric tube passes
below the level of the left hemidiaphragm with tip excluded from the
field of view.

Persistent opacity within the medial left lung base which may
reflect atelectasis. Aspiration is difficult to exclude.

## 2020-09-12 IMAGING — CT CT HEAD W/O CM
3 of 4 series · 13 of 47 positions shown, 15 images · non-contrast
Comparison: Brain MRI 09/22/2019
COMPARISON: Brain MRI 09/22/2019

Addendum:
CLINICAL DATA: Stroke, follow-up. Decreased responsiveness, CVA
admit.

EXAM:
CT HEAD WITHOUT CONTRAST
TECHNIQUE: Contiguous axial images were obtained from the base of the skull
through the vertex without intravenous contrast.

[Series 3: head wo · axial · 0.47mm/px · z∈[-128,-2]mm · 7 of 35 slices shown, 9 images]
[im 5/35  brain]
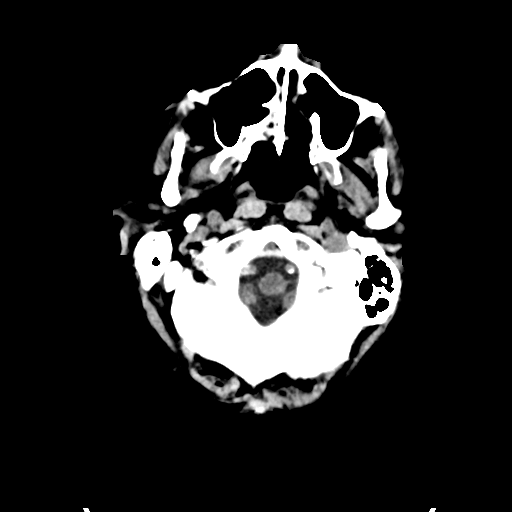
[im 5/35  bone]
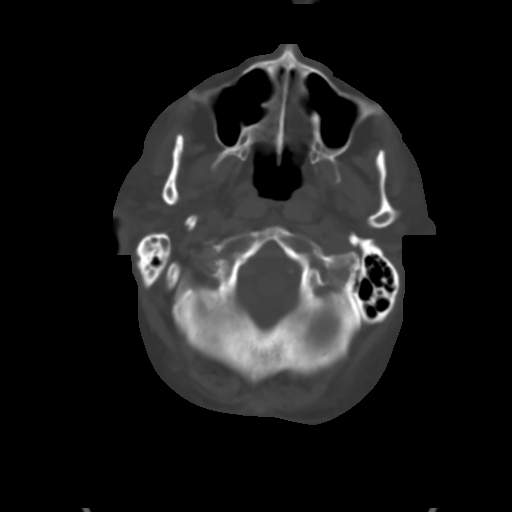
[im 9/35  brain]
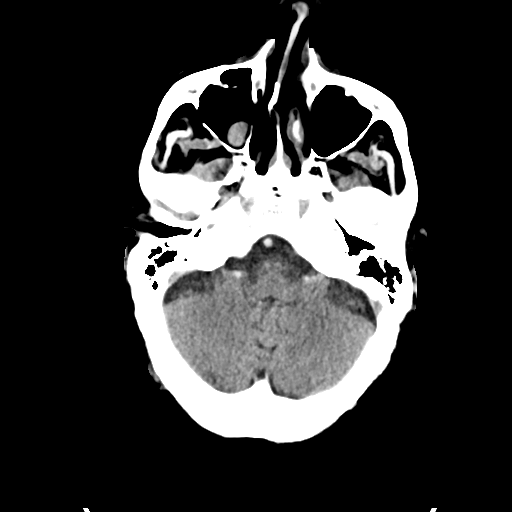
[im 13/35  brain]
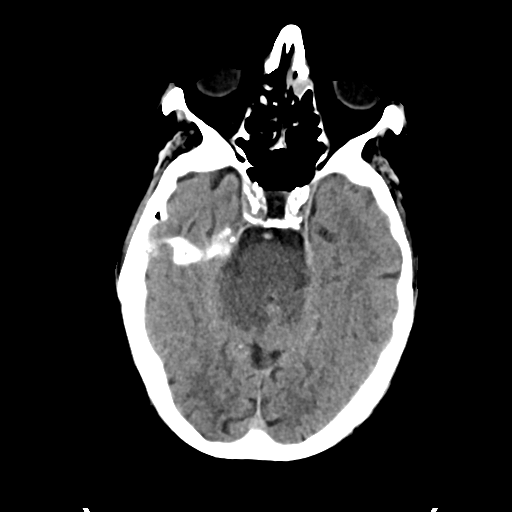
[im 18/35  brain]
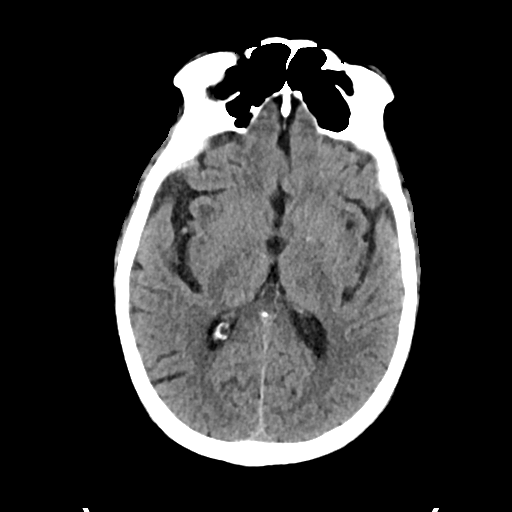
[im 22/35  brain]
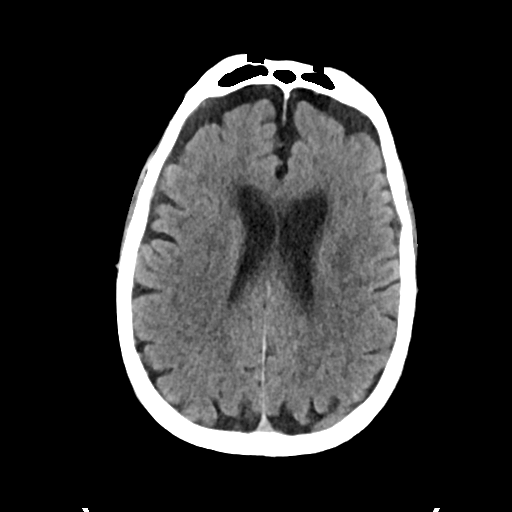
[im 22/35  bone]
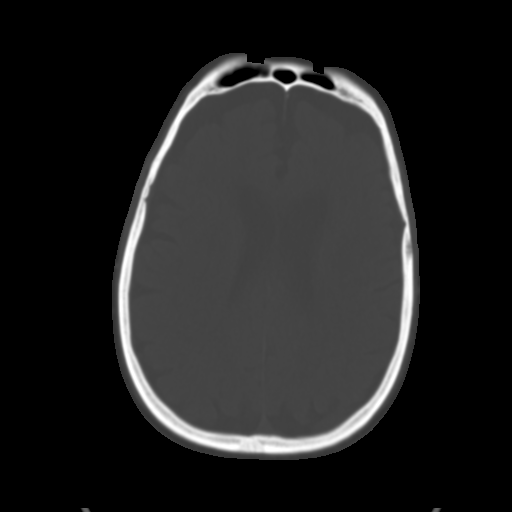
[im 26/35  brain]
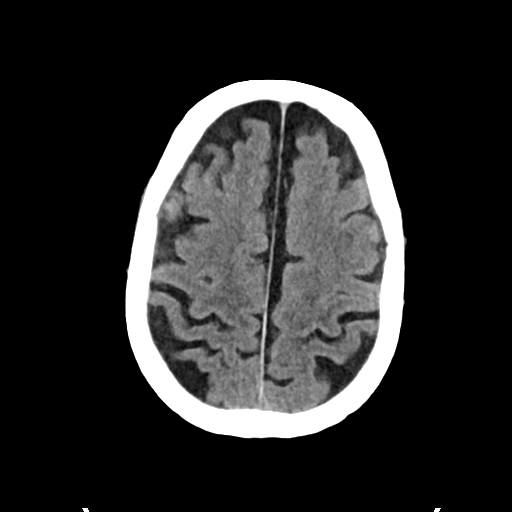
[im 30/35  brain]
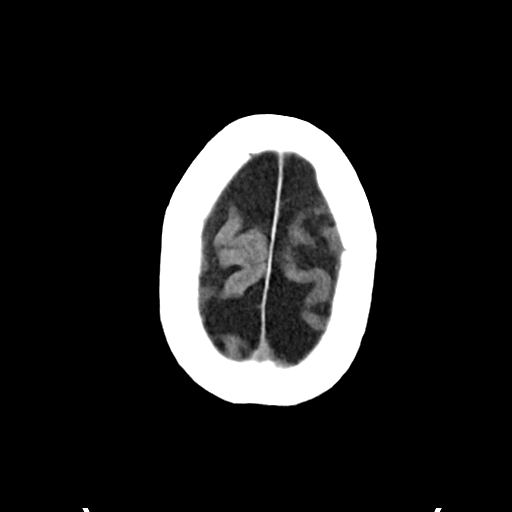

[Series 5: cor soft · coronal · 0.35mm/px · 3 of 76 slices shown]
[im 26/76  brain]
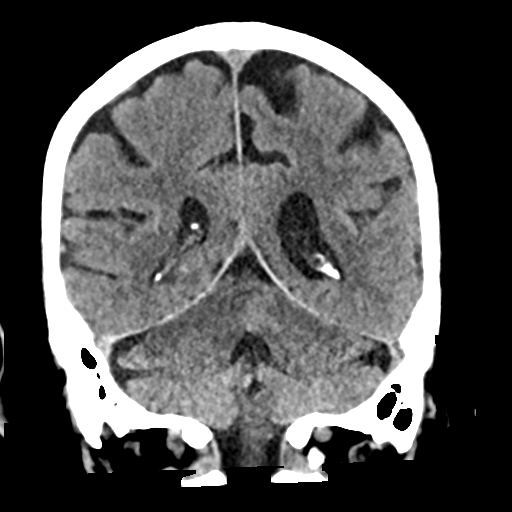
[im 34/76  brain]
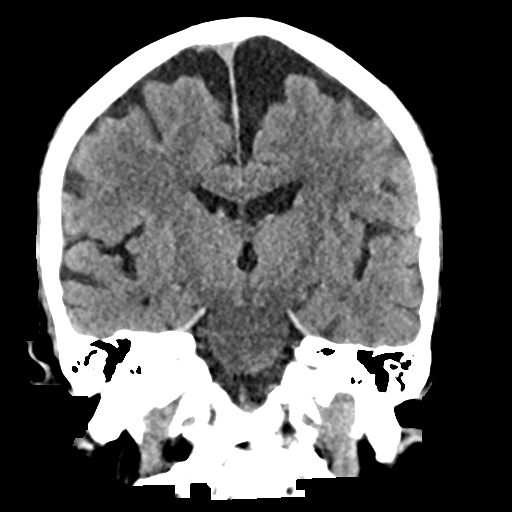
[im 42/76  brain]
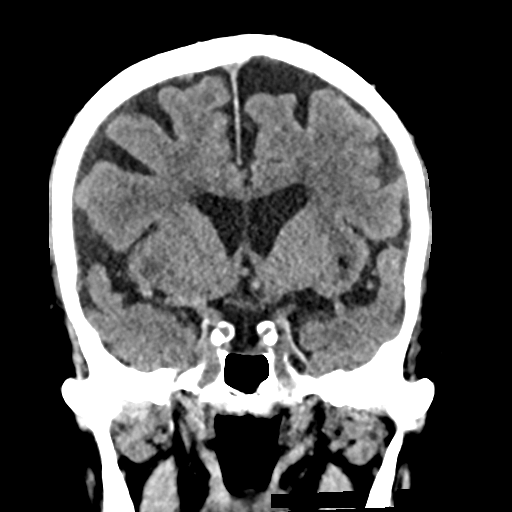

[Series 6: sag soft · sagittal · 0.35mm/px · 3 of 60 slices shown]
[im 20/60  brain]
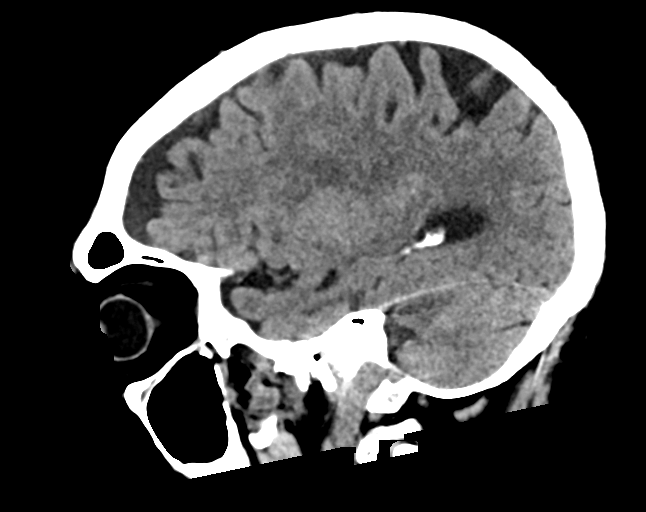
[im 30/60  brain]
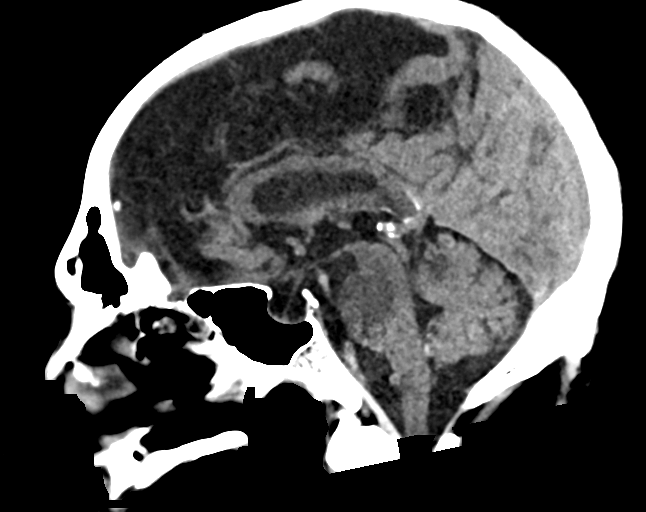
[im 40/60  brain]
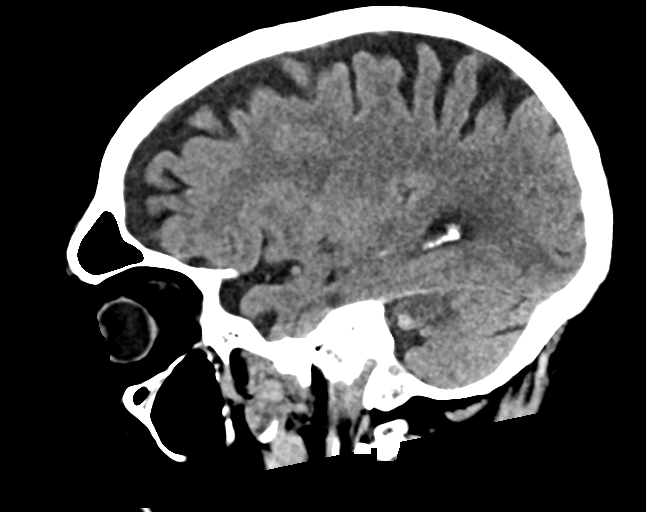

[13 of 47 positions shown; findings below may reference images not displayed]

FINDINGS: Brain:

There has been continued interval progression of acute/early
subacute posterior circulation ischemic infarcts. This includes
progressive infarction changes within the pons, which now extend to
involve the midbrain bilaterally. There are new extensive infarction
changes within the superior cerebellar hemispheres bilaterally also
extending into the middle cerebellar peduncles. There is no
hemorrhagic conversion. No significant effacement of the ventricular
system at this time. No hydrocephalus.

Elsewhere within the brain, there is stable generalized parenchymal
atrophy and chronic small vessel ischemic disease.

Redemonstrated 7 mm hyperdense extra-axial mass overlying the left
frontal operculum. In retrospect this was present on prior
examinations and likely reflects a small incidental meningioma.

No extra-axial fluid collection.

No midline shift.

Vascular: Hyperdensity within the basilar artery consistent with
known occlusion.

Skull: Normal. Negative for fracture or focal lesion.

Sinuses/Orbits: Visualized orbits show no acute finding. Mild
ethmoid sinus mucosal thickening. Right maxillary sinus mucous
retention cysts versus polyps. Right mastoid effusion.
IMPRESSION: Continued interval progression of acute/early subacute posterior
circulation ischemic infarcts in the setting of known basilar artery
occlusion. This includes progressive infarction changes within the
pons which now extend to involve the midbrain bilaterally. There are
also new extensive infarction changes within the superior cerebellar
hemispheres extending into the middle cerebellar peduncles. No
hemorrhagic conversion. No significant ventricular effacement at
this time. No hydrocephalus.

Elsewhere, there is stable generalized parenchymal atrophy and
chronic small vessel ischemic disease.

Redemonstrated 7 mm probable meningioma overlying the left frontal
operculum.

ADDENDUM:
These results were called by telephone at the time of interpretation
on 09/23/2019 at [DATE] to provider Dr. Grider, who verbally
acknowledged these results.

*** End of Addendum ***
FINDINGS: Brain:

There has been continued interval progression of acute/early
subacute posterior circulation ischemic infarcts. This includes
progressive infarction changes within the pons, which now extend to
involve the midbrain bilaterally. There are new extensive infarction
changes within the superior cerebellar hemispheres bilaterally also
extending into the middle cerebellar peduncles. There is no
hemorrhagic conversion. No significant effacement of the ventricular
system at this time. No hydrocephalus.

Elsewhere within the brain, there is stable generalized parenchymal
atrophy and chronic small vessel ischemic disease.

Redemonstrated 7 mm hyperdense extra-axial mass overlying the left
frontal operculum. In retrospect this was present on prior
examinations and likely reflects a small incidental meningioma.

No extra-axial fluid collection.

No midline shift.

Vascular: Hyperdensity within the basilar artery consistent with
known occlusion.

Skull: Normal. Negative for fracture or focal lesion.

Sinuses/Orbits: Visualized orbits show no acute finding. Mild
ethmoid sinus mucosal thickening. Right maxillary sinus mucous
retention cysts versus polyps. Right mastoid effusion.
IMPRESSION: Continued interval progression of acute/early subacute posterior
circulation ischemic infarcts in the setting of known basilar artery
occlusion. This includes progressive infarction changes within the
pons which now extend to involve the midbrain bilaterally. There are
also new extensive infarction changes within the superior cerebellar
hemispheres extending into the middle cerebellar peduncles. No
hemorrhagic conversion. No significant ventricular effacement at
this time. No hydrocephalus.

Elsewhere, there is stable generalized parenchymal atrophy and
chronic small vessel ischemic disease.

Redemonstrated 7 mm probable meningioma overlying the left frontal
operculum.
